# Patient Record
Sex: Female | Born: 1975
Health system: Southern US, Community
[De-identification: ages and names within clinical notes are randomized; demographics above are authoritative.]

## PROBLEM LIST (undated history)

## (undated) DIAGNOSIS — D649 Anemia, unspecified: Secondary | ICD-10-CM

## (undated) DIAGNOSIS — R519 Headache, unspecified: Secondary | ICD-10-CM

## (undated) DIAGNOSIS — T8859XA Other complications of anesthesia, initial encounter: Secondary | ICD-10-CM

## (undated) DIAGNOSIS — Z8739 Personal history of other diseases of the musculoskeletal system and connective tissue: Secondary | ICD-10-CM

## (undated) DIAGNOSIS — Z8742 Personal history of other diseases of the female genital tract: Secondary | ICD-10-CM

## (undated) DIAGNOSIS — T4145XA Adverse effect of unspecified anesthetic, initial encounter: Secondary | ICD-10-CM

## (undated) HISTORY — PX: WISDOM TOOTH EXTRACTION: SHX21

---
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
## 2004-09-26 ENCOUNTER — Other Ambulatory Visit: Admission: RE | Admit: 2004-09-26 | Discharge: 2004-09-26 | Payer: Self-pay | Admitting: Obstetrics and Gynecology

## 2004-09-30 ENCOUNTER — Ambulatory Visit (HOSPITAL_COMMUNITY): Admission: RE | Admit: 2004-09-30 | Discharge: 2004-09-30 | Payer: Self-pay | Admitting: Obstetrics and Gynecology

## 2005-01-21 ENCOUNTER — Ambulatory Visit (HOSPITAL_COMMUNITY): Admission: RE | Admit: 2005-01-21 | Discharge: 2005-01-21 | Payer: Self-pay | Admitting: Obstetrics and Gynecology

## 2005-02-09 ENCOUNTER — Inpatient Hospital Stay (HOSPITAL_COMMUNITY): Admission: AD | Admit: 2005-02-09 | Discharge: 2005-02-12 | Payer: Self-pay | Admitting: *Deleted

## 2010-03-11 ENCOUNTER — Ambulatory Visit (HOSPITAL_COMMUNITY): Admission: RE | Admit: 2010-03-11 | Discharge: 2010-03-11 | Payer: Self-pay | Admitting: Obstetrics

## 2010-07-25 ENCOUNTER — Inpatient Hospital Stay (HOSPITAL_COMMUNITY)
Admission: AD | Admit: 2010-07-25 | Discharge: 2010-07-27 | Payer: Self-pay | Source: Home / Self Care | Attending: Obstetrics & Gynecology | Admitting: Obstetrics & Gynecology

## 2010-07-28 LAB — ABO/RH: ABO/RH(D): O POS

## 2010-07-28 LAB — CBC
HCT: 38.3 % (ref 36.0–46.0)
HCT: 34.2 % — ABNORMAL LOW (ref 36.0–46.0)
Hemoglobin: 13.1 g/dL (ref 12.0–15.0)
Hemoglobin: 11.4 g/dL — ABNORMAL LOW (ref 12.0–15.0)
MCH: 31 pg (ref 26.0–34.0)
MCH: 30.8 pg (ref 26.0–34.0)
MCHC: 34.2 g/dL (ref 30.0–36.0)
MCHC: 33.3 g/dL (ref 30.0–36.0)
MCV: 90.5 fL (ref 78.0–100.0)
MCV: 92.4 fL (ref 78.0–100.0)
Platelets: 208 10*3/uL (ref 150–400)
Platelets: 172 10*3/uL (ref 150–400)
RBC: 4.23 MIL/uL (ref 3.87–5.11)
RBC: 3.7 MIL/uL — ABNORMAL LOW (ref 3.87–5.11)
RDW: 12.5 % (ref 11.5–15.5)
RDW: 12.8 % (ref 11.5–15.5)
WBC: 11 10*3/uL — ABNORMAL HIGH (ref 4.0–10.5)
WBC: 9.5 10*3/uL (ref 4.0–10.5)

## 2010-07-28 LAB — RPR: RPR Ser Ql: NONREACTIVE

## 2010-11-28 NOTE — H&P (Signed)
Rita Ballard             ACCOUNT NO.:  [id number]   MEDICAL RECORD NO.:  [id number]          PATIENT TYPE:  MAT   LOCATION:  [facility]                          FACILITY:  [facility]   PHYSICIAN:  Crist Fat. Rivard, M.D. DATE OF BIRTH:  05/09/1976   DATE OF ADMISSION:  02/09/2005  DATE OF DISCHARGE:                                HISTORY & PHYSICAL   HISTORY OF PRESENT ILLNESS:  Ms. Rita Ballard is a 35 year old married white  female, gravida 2, para 1-0-0-1, at 39-5/7 weeks, who presents with regular  uterine contractions this evening.  She denies leaking, bleeding, or signs  and symptoms of PIH.  Her pregnancy has been followed by the [facility] Certified Nurse Midwife service and has been remarkable for:  1.  Language barrier; 2.  Late to care; 3.  Previous C-section, desires  V-back; 4.  Group B Strep negative.   LABORATORY DATA:  Her prenatal labs were collected on September 26, 2004.  Hemoglobin 12.3, hematocrit 37, platelets 270,000.  Blood type O positive.  Antibody negative.  RPR nonreactive.  Rubella immune.  Hepatitis B surface  antigen negative.  Varicella immune.  Gonorrhea negative, Chlamydia  negative.  Pap smear was within normal limits.  One hour Glucola from Nov 26, 2004, was 112.  Culture of the vaginal tract for group B Strep on January 07, 2005, was negative.   HISTORY OF PRESENT PREGNANCY:  The patient presented for care at [facility]  September 26, 2004 at 20 weeks' gestation.  At that point she shared that she  had had a C-section in the [place] for breech and desired a vaginal birth  for this pregnancy.  Ultrasonography at 20 weeks' gestation showed growth  consistent with previous dating, confirming [facility] of February 11, 2005.  Her  record that she brought from the [place] at 25 weeks' gestation showed she  had Pfannenstiel incision  documented.  Ultrasonography performed for size  greater than dates at 31 weeks shows growth consistent with previous dating  and  estimated fetal weight in the 75th to 90th percentile with consistent  fluid, vertex presentation.  A 37-week ultrasound for ESW showed weight at  3453 to 3610 grams with normal fluid.  The patient met with Dr. Su Hilt at  38 weeks' gestation to review risks and benefits of vaginal birth after  cesarean.  The patient verbalized understanding of the risks to her and her  baby in the case of a uterine rupture.  The rest of her prenatal care has  been unremarkable.   OB HISTORY:  She is gravida 2, para 1-0-0-1.  In February 2003 she had a C-  section for a female infant, weighing 3.75 kg at 40 weeks' gestation under  general anesthesia with a breech presentation, and her name was Rita Ballard.  There were no complications with that pregnancy.   ALLERGIES:  No medication allergies.   PAST MEDICAL HISTORY:  1.  She experienced menarche at the age of 67 with 28-30 day cycles lasting      six days.  2.  The patient had labs from public  health showing rubella immunity.  3.  Nonsurgical hospitalization at age 72 for food poisoning.   PAST SURGICAL HISTORY:  Remarkable for a C=section in 2003.   GENETIC HISTORY:  The patient is a twin.  The patient had an eye problem as  a child that required eye drops and glasses for approximately five years.   SOCIAL HISTORY:  The patient is married to the father of the baby.  His name  is Aeriy.  He is supportive.  The patient has five years of college  education, is a homemaker.  The father of the baby has five years of college  education and is employed in flooring.  They deny any alcohol, tobacco, or  illicit drug use with this pregnancy.  They are [place].   PHYSICAL EXAMINATION:  VITAL SIGNS:  Stable.  She is afebrile.  HEENT:  Grossly within normal limits.  CHEST:  Clear to auscultation.  HEART:  Regular rate and rhythm.  ABDOMEN:  Gravid in contour with fundal height sitting at approximately 40  cm above pubic symphysis.  Fetal heart rate is  reassuming, positive  accelerations with occasional mild variables.  Contractions every 3-4  minutes.  CERVIX:  3 cm, 100%, vertex, -1 station.   ASSESSMENT:  1.  Intrauterine pregnancy at term.  2.  Desires vaginal birth after cesarean (VBAC).  3.  __________   PLAN:  1.  Admit to birthing suite.  Dr. Estanislado Pandy is notified.  2.  Rupture bag of waters.  3.  Give IV pain medications now.  4.  Reviewed with patient about the risks of vaginal birth after cesarean      including fetal distress, emergent c-section, maternal-fetal demise, and      they agreed to proceed.       KS/MEDQ  D:  02/09/2005  T:  02/09/2005  Job:  [id number]

## 2014-07-13 NOTE — L&D Delivery Note (Signed)
Delivery Note At 11:22 PM, on January 02, 2015, a viable female, "Name Undecided, was delivered via Vaginal, Spontaneous Delivery (Presentation: Middle Occiput Anterior) by nurse D. Ansah-Mensah, RNC. Provider in room and infant being placed on mother's abdomen.  Nurse gave tactile stimulation and infant APGARs: 8, 9. Cord clamped, cut, and blood collected.  Signs of placenta separation noted, but no delivery despite maternal pushing efforts.  After 30 minutes post delivery, pitocin started at bolus dosing. After 15 minutes, Crede's maneuver implemented and patient not tolerating pain and requesting pain medication.  Patient given 50 mcg of fentanyl IV and Crede's maneuver restarted with successful placental release at 0009. However, trailing membranes noted and provider able to grasp, with ring forceps, and remove ~ 6cm of membranes with clot noted on the posterior aspect.  Placenta inspection revealed it to be intact with 3VC. Vaginal inspection revealed a 1st degree perineal laceration. Lidocaine 1% used for local anesthetic with a 3.0 Vicryl suture on a SH needle.  Patient tolerated procedure well and peri-care discussed.   Fundus semi-firm, at the umbilicus, and bleeding moderate.  Patient given IM dose of methergine and started on methergine series for bleeding prophylaxis. Mother hemodynamically stable and infant skin to skin prior to provider exit.  Mother unsure of birth control method and opts to breastfeed.  Infant weight at one hour of life: 8lbs 5.7oz  Anesthesia: Local  Episiotomy: None Lacerations: 1st degree Suture Repair: 3.0 vicryl Est. Blood Loss (mL): 618     Mom to postpartum.  Baby to Couplet care / Skin to Skin.  Rita Muta MSN, CNM 01/03/2015, 12:25 AM

## 2014-08-01 LAB — OB RESULTS CONSOLE ABO/RH: RH Type: POSITIVE

## 2014-08-01 LAB — OB RESULTS CONSOLE RPR: RPR: NONREACTIVE

## 2014-08-01 LAB — OB RESULTS CONSOLE HIV ANTIBODY (ROUTINE TESTING): HIV: NONREACTIVE

## 2014-08-01 LAB — OB RESULTS CONSOLE GC/CHLAMYDIA
Chlamydia: NEGATIVE
Gonorrhea: NEGATIVE

## 2014-08-01 LAB — OB RESULTS CONSOLE RUBELLA ANTIBODY, IGM: Rubella: IMMUNE

## 2014-08-01 LAB — OB RESULTS CONSOLE HEPATITIS B SURFACE ANTIGEN: Hepatitis B Surface Ag: NEGATIVE

## 2014-08-01 LAB — OB RESULTS CONSOLE ANTIBODY SCREEN: Antibody Screen: NEGATIVE

## 2015-01-02 ENCOUNTER — Inpatient Hospital Stay (HOSPITAL_COMMUNITY)
Admission: AD | Admit: 2015-01-02 | Discharge: 2015-01-04 | DRG: 775 | Disposition: A | Discharge status: Home / Self Care | Payer: 59 | Source: Ambulatory Visit | Attending: Obstetrics and Gynecology | Admitting: Obstetrics and Gynecology

## 2015-01-02 ENCOUNTER — Encounter (HOSPITAL_COMMUNITY): Payer: Self-pay

## 2015-01-02 DIAGNOSIS — O09523 Supervision of elderly multigravida, third trimester: Secondary | ICD-10-CM

## 2015-01-02 DIAGNOSIS — IMO0001 Reserved for inherently not codable concepts without codable children: Secondary | ICD-10-CM

## 2015-01-02 DIAGNOSIS — O3421 Maternal care for scar from previous cesarean delivery: Principal | ICD-10-CM | POA: Diagnosis present

## 2015-01-02 DIAGNOSIS — Z3A38 38 weeks gestation of pregnancy: Secondary | ICD-10-CM | POA: Diagnosis present

## 2015-01-02 HISTORY — DX: Personal history of other diseases of the musculoskeletal system and connective tissue: Z87.39

## 2015-01-02 HISTORY — DX: Personal history of other diseases of the female genital tract: Z87.42

## 2015-01-02 LAB — TYPE AND SCREEN
ABO/RH(D): O POS
Antibody Screen: NEGATIVE

## 2015-01-02 LAB — CBC
HEMATOCRIT: 39.5 % (ref 36.0–46.0)
Hemoglobin: 13.8 g/dL (ref 12.0–15.0)
MCH: 31.3 pg (ref 26.0–34.0)
MCHC: 34.9 g/dL (ref 30.0–36.0)
MCV: 89.6 fL (ref 78.0–100.0)
PLATELETS: 240 10*3/uL (ref 150–400)
RBC: 4.41 MIL/uL (ref 3.87–5.11)
RDW: 13 % (ref 11.5–15.5)
WBC: 13.4 10*3/uL — AB (ref 4.0–10.5)

## 2015-01-02 LAB — OB RESULTS CONSOLE GBS: GBS: NEGATIVE

## 2015-01-02 MED ORDER — ACETAMINOPHEN 325 MG PO TABS: 650.0000 mg | ORAL_TABLET | ORAL | Status: DC | PRN

## 2015-01-02 MED ORDER — LACTATED RINGERS IV SOLN
INTRAVENOUS | Status: DC
  Administered 2015-01-02: 23:00:00 via INTRAVENOUS

## 2015-01-02 MED ORDER — OXYTOCIN BOLUS FROM INFUSION
500.0000 mL | INTRAVENOUS | Status: DC
  Administered 2015-01-02: 500 mL via INTRAVENOUS

## 2015-01-02 MED ORDER — OXYCODONE-ACETAMINOPHEN 5-325 MG PO TABS: 1.0000 | ORAL_TABLET | ORAL | Status: DC | PRN

## 2015-01-02 MED ORDER — LIDOCAINE HCL (PF) 1 % IJ SOLN
30.0000 mL | INTRAMUSCULAR | Status: DC | PRN
  Administered 2015-01-02: 30 mL via SUBCUTANEOUS
  Filled 2015-01-02: qty 30

## 2015-01-02 MED ORDER — LACTATED RINGERS IV SOLN: 500.0000 mL | INTRAVENOUS | Status: DC | PRN

## 2015-01-02 MED ORDER — ONDANSETRON HCL 4 MG/2ML IJ SOLN: 4.0000 mg | Freq: Four times a day (QID) | INTRAMUSCULAR | Status: DC | PRN

## 2015-01-02 MED ORDER — OXYTOCIN 40 UNITS IN LACTATED RINGERS INFUSION - SIMPLE MED
62.5000 mL/h | INTRAVENOUS | Status: DC
  Administered 2015-01-03: 62.5 mL/h via INTRAVENOUS
  Filled 2015-01-02: qty 1000

## 2015-01-02 MED ORDER — OXYCODONE-ACETAMINOPHEN 5-325 MG PO TABS: 2.0000 | ORAL_TABLET | ORAL | Status: DC | PRN

## 2015-01-02 MED ORDER — CITRIC ACID-SODIUM CITRATE 334-500 MG/5ML PO SOLN: 30.0000 mL | ORAL | Status: DC | PRN

## 2015-01-02 MED ORDER — FENTANYL CITRATE (PF) 100 MCG/2ML IJ SOLN
50.0000 ug | INTRAMUSCULAR | Status: DC | PRN
  Administered 2015-01-03: 50 ug via INTRAVENOUS
  Filled 2015-01-02: qty 2

## 2015-01-02 NOTE — MAU Note (Signed)
Pt c/o ct q 2-5 min. Denies SROM or bleeding. Good fetal movment.

## 2015-01-02 NOTE — MAU Note (Signed)
J.Emly ,CNM at bedside . SVE 7-8/100/-1 with BBOW.  A.Harpr, RN called for bed assignment. Pt to room 166.

## 2015-01-02 NOTE — H&P (Signed)
  Rita Ballard is a 39 y.o. female, G4P3003 at 50 weeks, presenting for contractions since 7pm.  Patient reports increase in intensity and frequency around 10pm.  Patient reports active fetus and denies LOF and VB.  Patient desires un medicated childbirth and is GBS negative. Patient with successful VBAC x 2 and plans another VBAC delivery.   There are no active problems to display for this patient.   History of present pregnancy: Patient entered care at 17.1 weeks.   EDC of 01/16/2015 was established by Definite LMP of 04/11/2014.   Anatomy scan:  20.1 weeks, with normal findings and an anterior placenta.   Additional [place] evaluations:   Anatomy:  Breech, Anterior placenta, No previa, Normal cord insertion. Normal fluid. Open hands, 5th didgit, NB, AA and DA seen   Significant prenatal events:  Patient late to [facility] at 17wks.  Declined Quad testing.  Patient with c/o leg and hip pain with burning and numbness, sleep issues, and pelvic pressure.  Patient reports h/o "clavicle fracture" with previous delivery of 8lb 13 oz infant, but declined additional [place] screening stating knowing weight would increase anxiety regarding delivery.      Last evaluation:  12/27/2014   37 wks 1 days   By S. Devane-Johnson, PH.D, CNM, FHR: 140 , SVE: 0cm / 70% / -2 BP: 122/60 sitting L arm Wt: 195 lbs  OB History    Gravida Para Term Preterm AB TAB SAB Ectopic Multiple Living   4 3 3       3      No past medical history on file. No past surgical history on file. Family History: family history is not on file. Social History:  has no tobacco, alcohol, and drug history on file.   Prenatal Transfer Tool  Maternal Diabetes: No Genetic Screening: Declined Maternal Ultrasounds/Referrals: Normal Fetal Ultrasounds or other Referrals:  None Maternal Substance Abuse:  No Significant Maternal Medications:  None Significant Maternal Lab Results: Lab values include: Group B Strep negative    ROS:  +ctx, -lof, -vb,  +fm  Allergies not on file   Dilation: 7.5 Effacement (%): 100 Station: 0, -1 Exam by:: J.Yvaine Jankowiak,CNM There were no vitals taken for this visit.  Physical Exam  Vitals reviewed. Constitutional: She is oriented to person, place, and time. She appears well-developed and well-nourished.  HENT:  Head: Normocephalic and atraumatic.  Eyes: EOM are normal.  Neck: Normal range of motion.  Cardiovascular: Normal rate and regular rhythm.   Respiratory: Effort normal.  GI: Soft.  Musculoskeletal: Normal range of motion. She exhibits no edema.  Neurological: She is alert and oriented to person, place, and time.  Skin: Skin is warm and dry.     FHR: 150 bpm, Mod Var, -Decels, +Accels UCs:  Q1-70min, palpates moderate to strong  Prenatal labs: ABO, Rh: O/Positive/-- (01/20 0000) Antibody: Negative (01/20 0000) Rubella:    Immune RPR: Nonreactive (01/20 0000)  HBsAg: Negative (01/20 0000)  HIV: Non-reactive (01/20 0000)  GBS: Negative (06/22 0000) Sickle cell/Hgb electrophoresis:  N/A Pap:  None GC:  Negative Chlamydia:  Negative Other:  None   Assessment IUP at 38wks Cat I FT Active Labor-Transition GBS Negative VBAC  Plan: Admit to [place] per consult with Dr. Scotty Court Routine Labor and Delivery Orders per [facility] Protocol Anticipate SVD  Gavin Pound [facility], MSN 01/02/2015, 10:51 PM

## 2015-01-03 ENCOUNTER — Encounter (HOSPITAL_COMMUNITY): Payer: Self-pay

## 2015-01-03 LAB — CBC
HEMATOCRIT: 37.4 % (ref 36.0–46.0)
HEMOGLOBIN: 12.9 g/dL (ref 12.0–15.0)
MCH: 31.3 pg (ref 26.0–34.0)
MCHC: 34.5 g/dL (ref 30.0–36.0)
MCV: 90.8 fL (ref 78.0–100.0)
Platelets: 224 10*3/uL (ref 150–400)
RBC: 4.12 MIL/uL (ref 3.87–5.11)
RDW: 13 % (ref 11.5–15.5)
WBC: 21.7 10*3/uL — ABNORMAL HIGH (ref 4.0–10.5)

## 2015-01-03 LAB — RPR: RPR Ser Ql: NONREACTIVE

## 2015-01-03 MED ORDER — ZOLPIDEM TARTRATE 5 MG PO TABS: 5.0000 mg | ORAL_TABLET | Freq: Every evening | ORAL | Status: DC | PRN

## 2015-01-03 MED ORDER — ONDANSETRON HCL 4 MG/2ML IJ SOLN: 4.0000 mg | INTRAMUSCULAR | Status: DC | PRN

## 2015-01-03 MED ORDER — OXYCODONE-ACETAMINOPHEN 5-325 MG PO TABS: 2.0000 | ORAL_TABLET | ORAL | Status: DC | PRN

## 2015-01-03 MED ORDER — PRENATAL MULTIVITAMIN CH
1.0000 | ORAL_TABLET | Freq: Every day | ORAL | Status: DC
  Administered 2015-01-03: 1 via ORAL
  Filled 2015-01-03: qty 1

## 2015-01-03 MED ORDER — BENZOCAINE-MENTHOL 20-0.5 % EX AERO
1.0000 "application " | INHALATION_SPRAY | CUTANEOUS | Status: DC | PRN
  Administered 2015-01-03: 1 via TOPICAL
  Filled 2015-01-03: qty 56

## 2015-01-03 MED ORDER — WITCH HAZEL-GLYCERIN EX PADS
1.0000 | MEDICATED_PAD | CUTANEOUS | Status: DC | PRN
Start: 2015-01-03 — End: 2015-01-04

## 2015-01-03 MED ORDER — TETANUS-DIPHTH-ACELL PERTUSSIS 5-2.5-18.5 LF-MCG/0.5 IM SUSP: 0.5000 mL | Freq: Once | INTRAMUSCULAR | Status: DC

## 2015-01-03 MED ORDER — DIPHENHYDRAMINE HCL 25 MG PO CAPS: 25.0000 mg | ORAL_CAPSULE | Freq: Four times a day (QID) | ORAL | Status: DC | PRN

## 2015-01-03 MED ORDER — LANOLIN HYDROUS EX OINT: TOPICAL_OINTMENT | CUTANEOUS | Status: DC | PRN

## 2015-01-03 MED ORDER — ACETAMINOPHEN 325 MG PO TABS
650.0000 mg | ORAL_TABLET | ORAL | Status: DC | PRN
  Administered 2015-01-03: 650 mg via ORAL
  Filled 2015-01-03: qty 2

## 2015-01-03 MED ORDER — SIMETHICONE 80 MG PO CHEW: 80.0000 mg | CHEWABLE_TABLET | ORAL | Status: DC | PRN

## 2015-01-03 MED ORDER — DIBUCAINE 1 % RE OINT: 1.0000 "application " | TOPICAL_OINTMENT | RECTAL | Status: DC | PRN

## 2015-01-03 MED ORDER — METHYLERGONOVINE MALEATE 0.2 MG PO TABS
0.2000 mg | ORAL_TABLET | Freq: Three times a day (TID) | ORAL | Status: AC
  Administered 2015-01-03 (×3): 0.2 mg via ORAL
  Filled 2015-01-03 (×3): qty 1

## 2015-01-03 MED ORDER — SENNOSIDES-DOCUSATE SODIUM 8.6-50 MG PO TABS
2.0000 | ORAL_TABLET | ORAL | Status: DC
  Administered 2015-01-04: 2 via ORAL
  Filled 2015-01-03 (×2): qty 2

## 2015-01-03 MED ORDER — METHYLERGONOVINE MALEATE 0.2 MG/ML IJ SOLN
0.2000 mg | Freq: Once | INTRAMUSCULAR | Status: AC
  Administered 2015-01-03: 0.2 mg via INTRAMUSCULAR
  Filled 2015-01-03: qty 1

## 2015-01-03 MED ORDER — ONDANSETRON HCL 4 MG PO TABS: 4.0000 mg | ORAL_TABLET | ORAL | Status: DC | PRN

## 2015-01-03 MED ORDER — OXYCODONE-ACETAMINOPHEN 5-325 MG PO TABS: 1.0000 | ORAL_TABLET | ORAL | Status: DC | PRN

## 2015-01-03 MED ORDER — IBUPROFEN 600 MG PO TABS
600.0000 mg | ORAL_TABLET | Freq: Four times a day (QID) | ORAL | Status: DC
  Administered 2015-01-03 – 2015-01-04 (×4): 600 mg via ORAL
  Filled 2015-01-03 (×5): qty 1

## 2015-01-03 NOTE — Lactation Note (Signed)
This note was copied from the chart of Rita Ballard. Lactation Consultation Note  P4, Ex BF  BF oldest child for 9 months, middle 6 months, youngest for 3 months. Denies questions or concerns. States breastfeeding is going well.  Has hand expressed drops of colostrum. Discussed cluster feeding. Mom encouraged to feed baby 8-12 times/24 hours and with feeding cues.  Mom made aware of O/P services, breastfeeding support groups, community resources, and our phone # for post-discharge questions.     Patient Name: Rita Ballard CLEXN'T Date: 01/03/2015 Reason for consult: Initial assessment   Maternal Data Has patient been taught Hand Expression?: Yes Does the patient have breastfeeding experience prior to this delivery?: Yes  Feeding Feeding Type: Breast Fed Length of feed: 30 min  LATCH Score/Interventions                      Lactation Tools Discussed/Used     Consult Status Consult Status: PRN    Carlye Grippe 01/03/2015, 3:02 PM

## 2015-01-03 NOTE — Progress Notes (Signed)
Subjective: Postpartum Day 1: Vaginal delivery (VBAC x 3), 1st degree perineal laceration Patient up ad lib, reports no syncope or dizziness. Feeding:  Breast Contraceptive plan:  Undecided  Family declines circumcision.  Objective: Vital signs in last 24 hours: Temp:  [97.4 F (36.3 C)-98.3 F (36.8 C)] 97.4 F (36.3 C) (06/23 0643) Pulse Rate:  [51-69] 53 (06/23 0643) Resp:  [18-22] 18 (06/23 0100) BP: (120-144)/(54-86) 126/68 mmHg (06/23 0643) SpO2:  [98 %] 98 % (06/23 0643) Weight:  [86.637 kg (191 lb)] 86.637 kg (191 lb) (06/22 2341)  Physical Exam:  General: alert Lochia: appropriate Uterine Fundus: firm Perineum: healing well DVT Evaluation: No evidence of DVT seen on physical exam. Negative Homan's sign.    Recent Labs  01/02/15 2310  HGB 13.8  HCT 39.5   Day 1 CBC results delayed in resulting due to internet issue.  Assessment/Plan: Status post vaginal delivery day 1. Stable Continue current care. Plan for discharge tomorrow    Allena Katz 01/03/2015, 9:13 AM

## 2015-01-04 MED ORDER — OXYCODONE-ACETAMINOPHEN 5-325 MG PO TABS: 1.0000 | ORAL_TABLET | ORAL | Status: DC | PRN

## 2015-01-04 MED ORDER — IBUPROFEN 600 MG PO TABS: 600.0000 mg | ORAL_TABLET | Freq: Four times a day (QID) | ORAL | Status: DC

## 2015-01-04 NOTE — Discharge Summary (Signed)
Vaginal Delivery Discharge Summary  ALL information will be verified prior to discharge  Rita Ballard  DOB:    1975/11/17 MRN:    [id number] CSN:    [id number]  Date of admission:                  01/02/15  Date of discharge:                   01/04/15  Procedures this admission: SVD  Date of Delivery: 01/02/15  Newborn Data:  Live born  Information for the patient's newborn:  Rickey, Farrier [[id number]]  female   Live born female  Birth Weight: 8 lb 5.7 oz (3790 g) APGAR: 8, 9  Home with mother. Name: Rita Ballard Circumcision Plan: none   History of Present Illness: Ms. BRIELLA HOBDAY is a 39 y.o. female, [phone], who presents at [redacted]w[redacted]d weeks gestation. The patient has been followed at the [facility] and Gynecology division of [facility]. She was admitted onset of labor. Her pregnancy has been complicated by:  Patient Active Problem List   Diagnosis Date Noted  . SVD (spontaneous vaginal delivery) 01/03/2015  . First degree perineal laceration during delivery 01/03/2015  . Active labor at term 01/02/2015    Hospital course: The patient was admitted for labor.   Her labor was not complicated. She proceeded to have a vaginal delivery of a healthy infant. Her delivery was not complicated. Her postpartum course was not complicated. She was discharged to home on postpartum day 2 doing well.  Feeding: breast  Contraception: no method  Discharge hemoglobin: HEMOGLOBIN  Date Value Ref Range Status  01/03/2015 12.9 12.0 - 15.0 g/dL Final   HCT  Date Value Ref Range Status  01/03/2015 37.4 36.0 - 46.0 % Final    PreNatal Labs ABO, Rh: --/--/O POS (06/22 2310)   Antibody: NEG (06/22 2310) Rubella:    immune RPR: Non Reactive (06/22 2310)  HBsAg: Negative (01/20 0000)  HIV: Non-reactive (01/20 0000)  GBS: Negative (06/22 0000)  Discharge Physical Exam:  General: alert and cooperative Lochia: appropriate Uterine Fundus:  firm Incision: healing well DVT Evaluation: No evidence of DVT seen on physical exam.  Intrapartum Procedures: spontaneous vaginal delivery Postpartum Procedures: none Complications-Operative and Postpartum: 1 degree perineal laceration  Discharge Diagnoses: Term Pregnancy-delivered,    Activity:           pelvic rest Diet:                routine Medications: PNV, Ibuprofen and Percocet Condition:      stable     Postpartum Teaching: Nutrition, exercise, return to work or school, family visits, sexual activity, home rest, vaginal bleeding, pelvic rest, family planning, s/s of PPD, breast care peri-care and incision care   Discharge to: home  Follow-up Information    Follow up with [facility]. Schedule an appointment as soon as possible for a visit in 6 weeks.   Specialty:  Obstetrics and Gynecology   Why:  Postpartum check up   Contact information:   [address] [place] [address] [phone]       Eleisha Branscomb, CNM, MSN 01/04/2015. 8:46 AM  All information will be verified prior to discharge   Postpartum Care After Vaginal Delivery  After you deliver your newborn (postpartum period), the usual stay in the hospital is 24 72 hours. If there were problems with your labor or delivery, or  if you have other medical problems, you might be in the hospital longer.  While you are in the hospital, you will receive help and instructions on how to care for yourself and your newborn during the postpartum period.  While you are in the hospital:  Be sure to tell your nurses if you have pain or discomfort, as well as where you feel the pain and what makes the pain worse.  If you had an incision made near your vagina (episiotomy) or if you had some tearing during delivery, the nurses may put ice packs on your episiotomy or tear. The ice packs may help to reduce the pain and swelling.  If you are breastfeeding, you  may feel uncomfortable contractions of your uterus for a couple of weeks. This is normal. The contractions help your uterus get back to normal size.  It is normal to have some bleeding after delivery.  For the first 1 3 days after delivery, the flow is red and the amount may be similar to a period.  It is common for the flow to start and stop.  In the first few days, you may pass some small clots. Let your nurses know if you begin to pass large clots or your flow increases.  Do not  flush blood clots down the toilet before having the nurse look at them.  During the next 3 10 days after delivery, your flow should become more watery and pink or brown-tinged in color.  Ten to fourteen days after delivery, your flow should be a small amount of yellowish-white discharge.  The amount of your flow will decrease over the first few weeks after delivery. Your flow may stop in 6 8 weeks. Most women have had their flow stop by 12 weeks after delivery.  You should change your sanitary pads frequently.  Wash your hands thoroughly with soap and water for at least 20 seconds after changing pads, using the toilet, or before holding or feeding your newborn.  You should feel like you need to empty your bladder within the first 6 8 hours after delivery.  In case you become weak, lightheaded, or faint, call your nurse before you get out of bed for the first time and before you take a shower for the first time.  Within the first few days after delivery, your breasts may begin to feel tender and full. This is called engorgement. Breast tenderness usually goes away within 48 72 hours after engorgement occurs. You may also notice milk leaking from your breasts. If you are not breastfeeding, do not stimulate your breasts. Breast stimulation can make your breasts produce more milk.  Spending as much time as possible with your newborn is very important. During this time, you and your newborn can feel close and get  to know each other. Having your newborn stay in your room (rooming in) will help to strengthen the bond with your newborn. It will give you time to get to know your newborn and become comfortable caring for your newborn.  Your hormones change after delivery. Sometimes the hormone changes can temporarily cause you to feel sad or tearful. These feelings should not last more than a few days. If these feelings last longer than that, you should talk to your caregiver.  If desired, talk to your caregiver about methods of family planning or contraception.  Talk to your caregiver about immunizations. Your caregiver may want you to have the following immunizations before leaving the hospital:  Tetanus, diphtheria, and  pertussis (Tdap) or tetanus and diphtheria (Td) immunization. It is very important that you and your family (including grandparents) or others caring for your newborn are up-to-date with the Tdap or Td immunizations. The Tdap or Td immunization can help protect your newborn from getting ill.  Rubella immunization.  Varicella (chickenpox) immunization.  Influenza immunization. You should receive this annual immunization if you did not receive the immunization during your pregnancy. Document Released: 04/26/2007 Document Revised: 03/23/2012 Document Reviewed: 02/24/2012 [facility] Patient Information 2014 [place].   Postpartum Depression and Baby Blues  The postpartum period begins right after the birth of a baby. During this time, there is often a great amount of joy and excitement. It is also a time of considerable changes in the life of the parent(s). Regardless of how many times a mother gives birth, each child brings new challenges and dynamics to the family. It is not unusual to have feelings of excitement accompanied by confusing shifts in moods, emotions, and thoughts. All mothers are at risk of developing postpartum depression or the "baby blues." These mood changes can occur  right after giving birth, or they may occur many months after giving birth. The baby blues or postpartum depression can be mild or severe. Additionally, postpartum depression can resolve rather quickly, or it can be a long-term condition. CAUSES Elevated hormones and their rapid decline are thought to be a main cause of postpartum depression and the baby blues. There are a number of hormones that radically change during and after pregnancy. Estrogen and progesterone usually decrease immediately after delivering your baby. The level of thyroid hormone and various cortisol steroids also rapidly drop. Other factors that play a major role in these changes include major life events and genetics.  RISK FACTORS If you have any of the following risks for the baby blues or postpartum depression, know what symptoms to watch out for during the postpartum period. Risk factors that may increase the likelihood of getting the baby blues or postpartum depression include: 1. Havinga personal or family history of depression. 2. Having depression while being pregnant. 3. Having premenstrual or oral contraceptive-associated mood issues. 4. Having exceptional life stress. 5. Having marital conflict. 6. Lacking a social support network. 7. Having a baby with special needs. 8. Having health problems such as diabetes. SYMPTOMS Baby blues symptoms include:  Brief fluctuations in mood, such as going from extreme happiness to sadness.  Decreased concentration.  Difficulty sleeping.  Crying spells, tearfulness.  Irritability.  Anxiety. Postpartum depression symptoms typically begin within the first month after giving birth. These symptoms include:  Difficulty sleeping or excessive sleepiness.  Marked weight loss.  Agitation.  Feelings of worthlessness.  Lack of interest in activity or food. Postpartum psychosis is a very concerning condition and can be dangerous. Fortunately, it is rare. Displaying any of  the following symptoms is cause for immediate medical attention. Postpartum psychosis symptoms include:  Hallucinations and delusions.  Bizarre or disorganized behavior.  Confusion or disorientation. DIAGNOSIS  A diagnosis is made by an evaluation of your symptoms. There are no medical or lab tests that lead to a diagnosis, but there are various questionnaires that a caregiver may use to identify those with the baby blues, postpartum depression, or psychosis. Often times, a screening tool called the [place] Postnatal Depression Scale is used to diagnose depression in the postpartum period.  TREATMENT The baby blues usually goes away on its own in 1 to 2 weeks. Social support is often all that is needed.  You should be encouraged to get adequate sleep and rest. Occasionally, you may be given medicines to help you sleep.  Postpartum depression requires treatment as it can last several months or longer if it is not treated. Treatment may include individual or group therapy, medicine, or both to address any social, physiological, and psychological factors that may play a role in the depression. Regular exercise, a healthy diet, rest, and social support may also be strongly recommended.  Postpartum psychosis is more serious and needs treatment right away. Hospitalization is often needed. HOME CARE INSTRUCTIONS  Get as much rest as you can. Nap when the baby sleeps.  Exercise regularly. Some women find yoga and walking to be beneficial.  Eat a balanced and nourishing diet.  Do little things that you enjoy. Have a cup of tea, take a bubble bath, read your favorite magazine, or listen to your favorite music.  Avoid alcohol.  Ask for help with household chores, cooking, grocery shopping, or running errands as needed. Do not try to do everything.  Talk to people close to you about how you are feeling. Get support from your partner, family members, friends, or other new moms.  Try to stay positive  in how you think. Think about the things you are grateful for.  Do not spend a lot of time alone.  Only take medicine as directed by your caregiver.  Keep all your postpartum appointments.  Let your caregiver know if you have any concerns. SEEK MEDICAL CARE IF: You are having a reaction or problems with your medicine. SEEK IMMEDIATE MEDICAL CARE IF:  You have suicidal feelings.  You feel you may harm the baby or someone else. Document Released: 04/02/2004 Document Revised: 09/21/2011 Document Reviewed: 05/05/2011 [facility] Patient Information 2014 [place], [place].     Breastfeeding Deciding to breastfeed is one of the best choices you can make for you and your baby. A change in hormones during pregnancy causes your breast tissue to grow and increases the number and size of your milk ducts. These hormones also allow proteins, sugars, and fats from your blood supply to make breast milk in your milk-producing glands. Hormones prevent breast milk from being released before your baby is born as well as prompt milk flow after birth. Once breastfeeding has begun, thoughts of your baby, as well as his or her sucking or crying, can stimulate the release of milk from your milk-producing glands.  BENEFITS OF BREASTFEEDING For Your Baby  Your first milk (colostrum) helps your baby's digestive system function better.   There are antibodies in your milk that help your baby fight off infections.   Your baby has a lower incidence of asthma, allergies, and sudden infant death syndrome.   The nutrients in breast milk are better for your baby than infant formulas and are designed uniquely for your baby's needs.   Breast milk improves your baby's brain development.   Your baby is less likely to develop other conditions, such as childhood obesity, asthma, or type 2 diabetes mellitus.  For You   Breastfeeding helps to create a very special bond between you and your baby.   Breastfeeding is  convenient. Breast milk is always available at the correct temperature and costs nothing.   Breastfeeding helps to burn calories and helps you lose the weight gained during pregnancy.   Breastfeeding makes your uterus contract to its prepregnancy size faster and slows bleeding (lochia) after you give birth.   Breastfeeding helps to lower your risk of developing  type 2 diabetes mellitus, osteoporosis, and breast or ovarian cancer later in life. SIGNS THAT YOUR BABY IS HUNGRY Early Signs of Hunger  Increased alertness or activity.  Stretching.  Movement of the head from side to side.  Movement of the head and opening of the mouth when the corner of the mouth or cheek is stroked (rooting).  Increased sucking sounds, smacking lips, cooing, sighing, or squeaking.  Hand-to-mouth movements.  Increased sucking of fingers or hands. Late Signs of Hunger  Fussing.  Intermittent crying. Extreme Signs of Hunger Signs of extreme hunger will require calming and consoling before your baby will be able to breastfeed successfully. Do not wait for the following signs of extreme hunger to occur before you initiate breastfeeding:   Restlessness.  A loud, strong cry.   Screaming.   BREASTFEEDING BASICS Breastfeeding Initiation  Find a comfortable place to sit or lie down, with your neck and back well supported.  Place a pillow or rolled up blanket under your baby to bring him or her to the level of your breast (if you are seated). Nursing pillows are specially designed to help support your arms and your baby while you breastfeed.  Make sure that your baby's abdomen is facing your abdomen.   Gently massage your breast. With your fingertips, massage from your chest wall toward your nipple in a circular motion. This encourages milk flow. You may need to continue this action during the feeding if your milk flows slowly.  Support your breast with 4 fingers underneath and your thumb above  your nipple. Make sure your fingers are well away from your nipple and your baby's mouth.   Stroke your baby's lips gently with your finger or nipple.   When your baby's mouth is open wide enough, quickly bring your baby to your breast, placing your entire nipple and as much of the colored area around your nipple (areola) as possible into your baby's mouth.   More areola should be visible above your baby's upper lip than below the lower lip.   Your baby's tongue should be between his or her lower gum and your breast.   Ensure that your baby's mouth is correctly positioned around your nipple (latched). Your baby's lips should create a seal on your breast and be turned out (everted).  It is common for your baby to suck about 2-3 minutes in order to start the flow of breast milk. Latching Teaching your baby how to latch on to your breast properly is very important. An improper latch can cause nipple pain and decreased milk supply for you and poor weight gain in your baby. Also, if your baby is not latched onto your nipple properly, he or she may swallow some air during feeding. This can make your baby fussy. Burping your baby when you switch breasts during the feeding can help to get rid of the air. However, teaching your baby to latch on properly is still the best way to prevent fussiness from swallowing air while breastfeeding. Signs that your baby has successfully latched on to your nipple:    Silent tugging or silent sucking, without causing you pain.   Swallowing heard between every 3-4 sucks.    Muscle movement above and in front of his or her ears while sucking.  Signs that your baby has not successfully latched on to nipple:   Sucking sounds or smacking sounds from your baby while breastfeeding.  Nipple pain. If you think your baby has not latched on correctly,  slip your finger into the corner of your baby's mouth to break the suction and place it between your baby's gums.  Attempt breastfeeding initiation again. Signs of Successful Breastfeeding Signs from your baby:   A gradual decrease in the number of sucks or complete cessation of sucking.   Falling asleep.   Relaxation of his or her body.   Retention of a small amount of milk in his or her mouth.   Letting go of your breast by himself or herself. Signs from you:  Breasts that have increased in firmness, weight, and size 1-3 hours after feeding.   Breasts that are softer immediately after breastfeeding.  Increased milk volume, as well as a change in milk consistency and color by the fifth day of breastfeeding.   Nipples that are not sore, cracked, or bleeding. Signs That Your [employer]el Books is Getting Enough Milk  Wetting at least 3 diapers in a 24-hour period. The urine should be clear and pale yellow by age 688 days.  At least 3 stools in a 24-hour period by age 688 days. The stool should be soft and yellow.  At least 3 stools in a 24-hour period by age 68 days. The stool should be seedy and yellow.  No loss of weight greater than 10% of birth weight during the first 54 days of age.  Average weight gain of 4-7 ounces (113-198 g) per week after age 33 days.  Consistent daily weight gain by age 29 days, without weight loss after the age of 2 weeks. After a feeding, your baby may spit up a small amount. This is common. BREASTFEEDING FREQUENCY AND DURATION Frequent feeding will help you make more milk and can prevent sore nipples and breast engorgement. Breastfeed when you feel the need to reduce the fullness of your breasts or when your baby shows signs of hunger. This is called "breastfeeding on demand." Avoid introducing a pacifier to your baby while you are working to establish breastfeeding (the first 4-6 weeks after your baby is born). After this time you may choose to use a pacifier. Research has shown that pacifier use during the first year of a baby's life decreases the risk of sudden infant death  syndrome (SIDS). Allow your baby to feed on each breast as long as he or she wants. Breastfeed until your baby is finished feeding. When your baby unlatches or falls asleep while feeding from the first breast, offer the second breast. Because newborns are often sleepy in the first few weeks of life, you may need to awaken your baby to get him or her to feed. Breastfeeding times will vary from baby to baby. However, the following rules can serve as a guide to help you ensure that your baby is properly fed:  Newborns (babies 50 weeks of age or younger) may breastfeed every 1-3 hours.  Newborns should not go longer than 3 hours during the day or 5 hours during the night without breastfeeding.  You should breastfeed your baby a minimum of 8 times in a 24-hour period until you begin to introduce solid foods to your baby at around 69 months of age. BREAST MILK PUMPING Pumping and storing breast milk allows you to ensure that your baby is exclusively fed your breast milk, even at times when you are unable to breastfeed. This is especially important if you are going back to work while you are still breastfeeding or when you are not able to be present during feedings. Your lactation consultant can give  you guidelines on how long it is safe to store breast milk.  A breast pump is a machine that allows you to pump milk from your breast into a sterile bottle. The pumped breast milk can then be stored in a refrigerator or freezer. Some breast pumps are operated by hand, while others use electricity. Ask your lactation consultant which type will work best for you. Breast pumps can be purchased, but some hospitals and breastfeeding support groups lease breast pumps on a monthly basis. A lactation consultant can teach you how to hand express breast milk, if you prefer not to use a pump.  CARING FOR YOUR BREASTS WHILE YOU BREASTFEED Nipples can become dry, cracked, and sore while breastfeeding. The following  recommendations can help keep your breasts moisturized and healthy:  Avoid using soap on your nipples.   Wear a supportive bra. Although not required, special nursing bras and tank tops are designed to allow access to your breasts for breastfeeding without taking off your entire bra or top. Avoid wearing underwire-style bras or extremely tight bras.  Air dry your nipples for 3-88minutes after each feeding.   Use only cotton bra pads to absorb leaked breast milk. Leaking of breast milk between feedings is normal.   Use lanolin on your nipples after breastfeeding. Lanolin helps to maintain your skin's normal moisture barrier. If you use pure lanolin, you do not need to wash it off before feeding your baby again. Pure lanolin is not toxic to your baby. You may also hand express a few drops of breast milk and gently massage that milk into your nipples and allow the milk to air dry. In the first few weeks after giving birth, some women experience extremely full breasts (engorgement). Engorgement can make your breasts feel heavy, warm, and tender to the touch. Engorgement peaks within 3-5 days after you give birth. The following recommendations can help ease engorgement:  Completely empty your breasts while breastfeeding or pumping. You may want to start by applying warm, moist heat (in the shower or with warm water-soaked hand towels) just before feeding or pumping. This increases circulation and helps the milk flow. If your baby does not completely empty your breasts while breastfeeding, pump any extra milk after he or she is finished.  Wear a snug bra (nursing or regular) or tank top for 1-2 days to signal your body to slightly decrease milk production.  Apply ice packs to your breasts, unless this is too uncomfortable for you.  Make sure that your baby is latched on and positioned properly while breastfeeding. If engorgement persists after 48 hours of following these recommendations, contact  your health care provider or a Science writer. OVERALL HEALTH CARE RECOMMENDATIONS WHILE BREASTFEEDING  Eat healthy foods. Alternate between meals and snacks, eating 3 of each per day. Because what you eat affects your breast milk, some of the foods may make your baby more irritable than usual. Avoid eating these foods if you are sure that they are negatively affecting your baby.  Drink milk, fruit juice, and water to satisfy your thirst (about 10 glasses a day).   Rest often, relax, and continue to take your prenatal vitamins to prevent fatigue, stress, and anemia.  Continue breast self-awareness checks.  Avoid chewing and smoking tobacco.  Avoid alcohol and drug use. Some medicines that may be harmful to your baby can pass through breast milk. It is important to ask your health care provider before taking any medicine, including all over-the-counter and prescription medicine  as well as vitamin and herbal supplements. It is possible to become pregnant while breastfeeding. If birth control is desired, ask your health care provider about options that will be safe for your baby. SEEK MEDICAL CARE IF:   You feel like you want to stop breastfeeding or have become frustrated with breastfeeding.  You have painful breasts or nipples.  Your nipples are cracked or bleeding.  Your breasts are red, tender, or warm.  You have a swollen area on either breast.  You have a fever or chills.  You have nausea or vomiting.  You have drainage other than breast milk from your nipples.  Your breasts do not become full before feedings by the fifth day after you give birth.  You feel sad and depressed.  Your baby is too sleepy to eat well.  Your baby is having trouble sleeping.   Your baby is wetting less than 3 diapers in a 24-hour period.  Your baby has less than 3 stools in a 24-hour period.  Your baby's skin or the white part of his or her eyes becomes yellow.   Your baby is not  gaining weight by 102 days of age. SEEK IMMEDIATE MEDICAL CARE IF:   Your baby is overly tired (lethargic) and does not want to wake up and feed.  Your baby develops an unexplained fever. Document Released: 06/29/2005 Document Revised: 07/04/2013 Document Reviewed: 12/21/2012 [facility] Patient Information 2015 [place], [place]. This information is not intended to replace advice given to you by your health care provider. Make sure you discuss any questions you have with your health care provider.

## 2016-07-15 DIAGNOSIS — M9901 Segmental and somatic dysfunction of cervical region: Secondary | ICD-10-CM | POA: Diagnosis not present

## 2016-07-15 DIAGNOSIS — M531 Cervicobrachial syndrome: Secondary | ICD-10-CM | POA: Diagnosis not present

## 2016-07-15 DIAGNOSIS — R51 Headache: Secondary | ICD-10-CM | POA: Diagnosis not present

## 2016-07-22 DIAGNOSIS — R51 Headache: Secondary | ICD-10-CM | POA: Diagnosis not present

## 2016-07-22 DIAGNOSIS — M9901 Segmental and somatic dysfunction of cervical region: Secondary | ICD-10-CM | POA: Diagnosis not present

## 2016-07-22 DIAGNOSIS — M531 Cervicobrachial syndrome: Secondary | ICD-10-CM | POA: Diagnosis not present

## 2016-07-31 DIAGNOSIS — Z01419 Encounter for gynecological examination (general) (routine) without abnormal findings: Secondary | ICD-10-CM | POA: Diagnosis not present

## 2016-08-19 DIAGNOSIS — M531 Cervicobrachial syndrome: Secondary | ICD-10-CM | POA: Diagnosis not present

## 2016-08-19 DIAGNOSIS — M9901 Segmental and somatic dysfunction of cervical region: Secondary | ICD-10-CM | POA: Diagnosis not present

## 2016-08-19 DIAGNOSIS — R51 Headache: Secondary | ICD-10-CM | POA: Diagnosis not present

## 2016-11-04 DIAGNOSIS — J069 Acute upper respiratory infection, unspecified: Secondary | ICD-10-CM | POA: Diagnosis not present

## 2016-12-02 DIAGNOSIS — Z1329 Encounter for screening for other suspected endocrine disorder: Secondary | ICD-10-CM | POA: Diagnosis not present

## 2016-12-02 DIAGNOSIS — Z131 Encounter for screening for diabetes mellitus: Secondary | ICD-10-CM | POA: Diagnosis not present

## 2016-12-02 DIAGNOSIS — Z13220 Encounter for screening for lipoid disorders: Secondary | ICD-10-CM | POA: Diagnosis not present

## 2016-12-02 DIAGNOSIS — Z0000 Encounter for general adult medical examination without abnormal findings: Secondary | ICD-10-CM | POA: Diagnosis not present

## 2017-08-11 DIAGNOSIS — Z01419 Encounter for gynecological examination (general) (routine) without abnormal findings: Secondary | ICD-10-CM | POA: Diagnosis not present

## 2017-10-16 DIAGNOSIS — J329 Chronic sinusitis, unspecified: Secondary | ICD-10-CM | POA: Diagnosis not present

## 2017-10-20 DIAGNOSIS — J069 Acute upper respiratory infection, unspecified: Secondary | ICD-10-CM | POA: Diagnosis not present

## 2018-03-16 DIAGNOSIS — Z136 Encounter for screening for cardiovascular disorders: Secondary | ICD-10-CM | POA: Diagnosis not present

## 2018-03-16 DIAGNOSIS — Z0000 Encounter for general adult medical examination without abnormal findings: Secondary | ICD-10-CM | POA: Diagnosis not present

## 2019-07-14 DIAGNOSIS — C801 Malignant (primary) neoplasm, unspecified: Secondary | ICD-10-CM

## 2019-07-14 HISTORY — DX: Malignant (primary) neoplasm, unspecified: C80.1

## 2019-07-26 ENCOUNTER — Ambulatory Visit: Payer: 59 | Attending: Internal Medicine

## 2019-07-26 ENCOUNTER — Other Ambulatory Visit: Payer: Self-pay

## 2019-07-26 DIAGNOSIS — Z20822 Contact with and (suspected) exposure to covid-19: Secondary | ICD-10-CM

## 2019-07-27 LAB — NOVEL CORONAVIRUS, NAA: SARS-CoV-2, NAA: NOT DETECTED

## 2019-09-19 ENCOUNTER — Other Ambulatory Visit: Payer: Self-pay | Admitting: Obstetrics and Gynecology

## 2019-09-19 DIAGNOSIS — R921 Mammographic calcification found on diagnostic imaging of breast: Secondary | ICD-10-CM

## 2019-10-06 ENCOUNTER — Other Ambulatory Visit: Payer: 59

## 2019-10-06 ENCOUNTER — Other Ambulatory Visit: Payer: Self-pay | Admitting: Obstetrics and Gynecology

## 2019-10-06 ENCOUNTER — Other Ambulatory Visit: Payer: Self-pay

## 2019-10-06 ENCOUNTER — Ambulatory Visit
Admission: RE | Admit: 2019-10-06 | Discharge: 2019-10-06 | Disposition: A | Discharge status: Home / Self Care | Payer: 59 | Source: Ambulatory Visit | Attending: Obstetrics and Gynecology | Admitting: Obstetrics and Gynecology

## 2019-10-06 DIAGNOSIS — R921 Mammographic calcification found on diagnostic imaging of breast: Secondary | ICD-10-CM

## 2019-10-13 ENCOUNTER — Other Ambulatory Visit: Payer: Self-pay

## 2019-10-13 ENCOUNTER — Ambulatory Visit
Admission: RE | Admit: 2019-10-13 | Discharge: 2019-10-13 | Disposition: A | Discharge status: Home / Self Care | Payer: 59 | Source: Ambulatory Visit | Attending: Obstetrics and Gynecology | Admitting: Obstetrics and Gynecology

## 2019-10-13 DIAGNOSIS — R921 Mammographic calcification found on diagnostic imaging of breast: Secondary | ICD-10-CM

## 2019-10-24 NOTE — Progress Notes (Signed)
[place] CONSULT NOTE  Patient Care Team: Patient, [name] as PCP - General (General Practice)  CHIEF COMPLAINTS/PURPOSE OF CONSULTATION:  Newly diagnosed left breast DCIS  HISTORY OF PRESENTING ILLNESS:  Rita Ballard 44 y.o. female is here because of recent diagnosis of left breast ductal carcinoma in situ. Screening mammogram detected left breast calcifications. Diagnostic mammogram on 10/06/19 showed calcifications involving the majority of the upper outer left breast, spanning 9.0cm in total. Biopsy on 10/13/19 showed high grade DCIS, ER/PR negative. She presents to the clinic today for initial evaluation and discussion of treatment options.    I reviewed her records extensively and collaborated the history with the patient.  SUMMARY OF ONCOLOGIC HISTORY: Oncology History  Malignant neoplasm of upper-outer quadrant of left breast in female, estrogen receptor negative ([place])  10/13/2019 Cancer Staging   Staging form: Breast, AJCC 8th Edition - Clinical stage from 10/13/2019: Stage 0 (cTis (DCIS), cN0, cM0, ER-, PR-) - Signed by Gardenia Phlegm, NP on 10/25/2019   10/13/2019 Initial Diagnosis   Screening detected left breast calcifications with high breast density, UOQ: 9 x 8 x 5 cm.  Anterior and posterior end of the calcifications were biopsied revealing high-grade DCIS with calcifications ER 0%, PR 0%     MEDICAL HISTORY:  Past Medical History:  Diagnosis Date  . Hx of endometriosis   . Hx of scoliosis    As a child    SURGICAL HISTORY: Past Surgical History:  Procedure Laterality Date  . CESAREAN SECTION    . WISDOM TOOTH EXTRACTION      SOCIAL HISTORY: Social History   Socioeconomic History  . Marital status: Married    Spouse name: Not on file  . Number of children: Not on file  . Years of education: Not on file  . Highest education level: Not on file  Occupational History  . Not on file  Tobacco Use  . Smoking status: Never  Smoker  . Smokeless tobacco: Never Used  Substance and Sexual Activity  . Alcohol use: No  . Drug use: No  . Sexual activity: Not on file  Other Topics Concern  . Not on file  Social History Narrative  . Not on file   Social Determinants of Health   Financial Resource Strain:   . Difficulty of Paying Living Expenses:   Food Insecurity:   . Worried About Charity fundraiser in the Last Year:   . Arboriculturist in the Last Year:   Transportation Needs:   . Film/video editor (Medical):   Marland Kitchen Lack of Transportation (Non-Medical):   Physical Activity:   . Days of Exercise per Week:   . Minutes of Exercise per Session:   Stress:   . Feeling of Stress :   Social Connections:   . Frequency of Communication with Friends and Family:   . Frequency of Social Gatherings with Friends and Family:   . Attends Religious Services:   . Active Member of Clubs or Organizations:   . Attends Archivist Meetings:   Marland Kitchen Marital Status:   Intimate Partner Violence:   . Fear of Current or Ex-Partner:   . Emotionally Abused:   Marland Kitchen Physically Abused:   . Sexually Abused:     FAMILY HISTORY: No family history on file.  ALLERGIES:  has No Known Allergies.  MEDICATIONS:  Current Outpatient Medications  Medication Sig Dispense Refill  . ibuprofen (ADVIL,MOTRIN) 600 MG tablet Take 1 tablet (  600 mg total) by mouth every 6 (six) hours. 30 tablet 0  . oxyCODONE-acetaminophen (PERCOCET/ROXICET) 5-325 MG per tablet Take 1 tablet by mouth every 4 (four) hours as needed (for pain scale 4-7). 30 tablet 0  . Prenatal Vit-Fe Fumarate-FA (PRENATAL MULTIVITAMIN) TABS tablet Take 1 tablet by mouth daily at 12 noon.     No current facility-administered medications for this visit.    REVIEW OF SYSTEMS:   Constitutional: Denies fevers, chills or abnormal night sweats Eyes: Denies blurriness of vision, double vision or watery eyes Ears, nose, mouth, throat, and face: Denies mucositis or sore  throat Respiratory: Denies cough, dyspnea or wheezes Cardiovascular: Denies palpitation, chest discomfort or lower extremity swelling Gastrointestinal:  Denies nausea, heartburn or change in bowel habits Skin: Denies abnormal skin rashes Lymphatics: Denies new lymphadenopathy or easy bruising Neurological:Denies numbness, tingling or new weaknesses Behavioral/Psych: Mood is stable, no new changes  Breast: Denies any palpable lumps or discharge All other systems were reviewed with the patient and are negative.  PHYSICAL EXAMINATION: ECOG PERFORMANCE STATUS: 0 - Asymptomatic  There were no vitals filed for this visit. Filed Weights   10/25/19 1426  Weight: 176 lb 3.2 oz (79.9 kg)    GENERAL:alert, no distress and comfortable SKIN: skin color, texture, turgor are normal, no rashes or significant lesions EYES: normal, conjunctiva are pink and non-injected, sclera clear OROPHARYNX:no exudate, no erythema and lips, buccal mucosa, and tongue normal  NECK: supple, thyroid normal size, non-tender, without nodularity LYMPH:  no palpable lymphadenopathy in the cervical, axillary or inguinal LUNGS: clear to auscultation and percussion with normal breathing effort HEART: regular rate & rhythm and no murmurs and no lower extremity edema ABDOMEN:abdomen soft, non-tender and normal bowel sounds Musculoskeletal:no cyanosis of digits and no clubbing  PSYCH: alert & oriented x 3 with fluent speech NEURO: no focal motor/sensory deficits BREAST: No palpable nodules in breast. No palpable axillary or supraclavicular lymphadenopathy (exam performed in the presence of a chaperone)   LABORATORY DATA:  I have reviewed the data as listed Lab Results  Component Value Date   WBC 21.7 (H) 01/03/2015   HGB 12.9 01/03/2015   HCT 37.4 01/03/2015   MCV 90.8 01/03/2015   PLT 224 01/03/2015   No results found for: NA, K, CL, CO2  RADIOGRAPHIC STUDIES: I have personally reviewed the radiological reports  and agreed with the findings in the report.  ASSESSMENT AND PLAN:  Malignant neoplasm of upper-outer quadrant of left breast in female, estrogen receptor negative ([place]) 10/13/2019:Screening detected left breast calcifications with high breast density, UOQ: 9 x 8 x 5 cm.  Anterior and posterior end of the calcifications were biopsied revealing high-grade DCIS with calcifications ER 0%, PR 0% Tis NX stage 0  Pathology review: I discussed with the patient the difference between DCIS and invasive breast cancer. It is considered a precancerous lesion. DCIS is classified as a 0. It is generally detected through mammograms as calcifications. We discussed the significance of grades and its impact on prognosis. We also discussed the importance of ER and PR receptors and their implications to adjuvant treatment options. Prognosis of DCIS dependence on grade, comedo necrosis. It is anticipated that if not treated, 20-30% of DCIS can develop into invasive breast cancer.  Recommendation: Patient plans to do bilateral mastectomies.  Patient is awaiting consultation from plastic surgery. There is no role of radiation or antiestrogen therapy.  Genetics consultation will be requested.  Return to clinic after surgery to discuss the final pathology report  through [place] virtual visit.  All questions were answered. The patient knows to call the clinic with any problems, questions or concerns.   Rulon Eisenmenger, MD, MPH 10/25/2019    I, Molly Dorshimer, am acting as scribe for Nicholas Lose, MD.  I have reviewed the above documentation for accuracy and completeness, and I agree with the above.

## 2019-10-25 ENCOUNTER — Encounter: Payer: Self-pay | Admitting: Licensed Clinical Social Worker

## 2019-10-25 ENCOUNTER — Encounter: Payer: Self-pay | Admitting: *Deleted

## 2019-10-25 ENCOUNTER — Ambulatory Visit: Payer: 59 | Admitting: Licensed Clinical Social Worker

## 2019-10-25 ENCOUNTER — Encounter: Payer: Self-pay | Admitting: Adult Health

## 2019-10-25 ENCOUNTER — Inpatient Hospital Stay: Payer: 59

## 2019-10-25 ENCOUNTER — Other Ambulatory Visit: Payer: Self-pay

## 2019-10-25 ENCOUNTER — Inpatient Hospital Stay: Payer: 59 | Attending: Hematology and Oncology | Admitting: Hematology and Oncology

## 2019-10-25 DIAGNOSIS — D0512 Intraductal carcinoma in situ of left breast: Secondary | ICD-10-CM | POA: Diagnosis not present

## 2019-10-25 DIAGNOSIS — C50412 Malignant neoplasm of upper-outer quadrant of left female breast: Secondary | ICD-10-CM

## 2019-10-25 DIAGNOSIS — Z171 Estrogen receptor negative status [ER-]: Secondary | ICD-10-CM | POA: Diagnosis not present

## 2019-10-25 MED ORDER — MULTIVITAMINS PO CAPS: 1.0000 | ORAL_CAPSULE | Freq: Every day | ORAL | Status: AC

## 2019-10-25 MED ORDER — FISH OIL 1000 MG PO CPDR: 1.0000 | DELAYED_RELEASE_CAPSULE | Freq: Every day | ORAL | Status: AC

## 2019-10-25 NOTE — Assessment & Plan Note (Signed)
10/13/2019:Screening detected left breast calcifications with high breast density, UOQ: 9 x 8 x 5 cm.  Anterior and posterior end of the calcifications were biopsied revealing high-grade DCIS with calcifications ER 0%, PR 0% Tis NX stage 0  Pathology review: I discussed with the patient the difference between DCIS and invasive breast cancer. It is considered a precancerous lesion. DCIS is classified as a 0. It is generally detected through mammograms as calcifications. We discussed the significance of grades and its impact on prognosis. We also discussed the importance of ER and PR receptors and their implications to adjuvant treatment options. Prognosis of DCIS dependence on grade, comedo necrosis. It is anticipated that if not treated, 20-30% of DCIS can develop into invasive breast cancer.  Recommendation: Patient plans to do bilateral mastectomies. There is no role of radiation or antiestrogen therapy.  Return to clinic after surgery to discuss the final pathology report through [place] virtual visit.

## 2019-10-25 NOTE — Progress Notes (Signed)
REFERRING PROVIDER: Nicholas Lose, MD [place],  [place] [id number]  PRIMARY PROVIDER:  Patient, [name]  PRIMARY REASON FOR VISIT:  1. Malignant neoplasm of upper-outer quadrant of left breast in female, estrogen receptor negative ([place])    I connected with Rita Ballard on 10/25/2019 at 3:00 PM EDT by Webex and verified that I am speaking with the correct person using two identifiers.    Patient location: [facility] Provider location: [facility]  HISTORY OF PRESENT ILLNESS:   Rita Ballard, a 44 y.o. female, was seen for a [facility] cancer genetics consultation at the request of Dr. Lindi Adie due to her recent diagnosis of breast cancer.  Rita Ballard presents to clinic today to discuss the possibility of a hereditary predisposition to cancer, genetic testing, and to further clarify her future cancer risks, as well as potential cancer risks for family members.   In 2021, at the age of 85, Rita Ballard was diagnosed with DCIS of the left breast, ER/PR-. The treatment plan includes bilateral mastectomies. She is also planning to have a hysterectomy.   CANCER HISTORY:  Oncology History  Malignant neoplasm of upper-outer quadrant of left breast in female, estrogen receptor negative ([place])  10/13/2019 Cancer Staging   Staging form: Breast, AJCC 8th Edition - Clinical stage from 10/13/2019: Stage 0 (cTis (DCIS), cN0, cM0, ER-, PR-) - Signed by Gardenia Phlegm, NP on 10/25/2019   10/13/2019 Initial Diagnosis   Screening detected left breast calcifications with high breast density, UOQ: 9 x 8 x 5 cm.  Anterior and posterior end of the calcifications were biopsied revealing high-grade DCIS with calcifications ER 0%, PR 0%      RISK FACTORS:  Menarche was at age 56.  First live birth at age 72.  OCP use for approximately 0 years.  Ovaries intact: yes.  Hysterectomy: no.  Menopausal status: premenopausal.  HRT use: 0 years. Colonoscopy: no; not  examined. Mammogram within the last year: yes Number of breast biopsies: 2. Up to date with pelvic exams: yes. Any excessive radiation exposure in the past: no  Past Medical History:  Diagnosis Date  . Hx of endometriosis   . Hx of scoliosis    As a child    Past Surgical History:  Procedure Laterality Date  . CESAREAN SECTION    . WISDOM TOOTH EXTRACTION      Social History   Socioeconomic History  . Marital status: Married    Spouse name: Not on file  . Number of children: Not on file  . Years of education: Not on file  . Highest education level: Not on file  Occupational History  . Not on file  Tobacco Use  . Smoking status: Never Smoker  . Smokeless tobacco: Never Used  Substance and Sexual Activity  . Alcohol use: No  . Drug use: No  . Sexual activity: Not on file  Other Topics Concern  . Not on file  Social History Narrative  . Not on file   Social Determinants of Health   Financial Resource Strain:   . Difficulty of Paying Living Expenses:   Food Insecurity:   . Worried About Charity fundraiser in the Last Year:   . Arboriculturist in the Last Year:   Transportation Needs:   . Film/video editor (Medical):   Marland Kitchen Lack of Transportation (Non-Medical):   Physical Activity:   . Days of Exercise per Week:   . Minutes of Exercise per  Session:   Stress:   . Feeling of Stress :   Social Connections:   . Frequency of Communication with Friends and Family:   . Frequency of Social Gatherings with Friends and Family:   . Attends Religious Services:   . Active Member of Clubs or Organizations:   . Attends Archivist Meetings:   Marland Kitchen Marital Status:      FAMILY HISTORY:  We obtained a detailed, 4-generation family history.  Significant diagnoses are listed below: No family history on file.   Rita Ballard has 2 sons and 2 daughters. She has a twin sister and an older brother, no cancers.   Rita Ballard mother is living at 11 and has never had  cancer. Patient had 2 maternal aunts. No cancers on this side of the family. Maternal grandmother died in her 43s, grandfather died around 71.   Rita Ballard father is living at 49 with no history of cancer. Patient does not have information about his side of the family.   Rita Ballard is unaware of previous family history of genetic testing for hereditary cancer risks. Patient's maternal ancestors are of [place] descent, and paternal ancestors are of [place] descent. There is no reported Ashkenazi Jewish ancestry. There is no known consanguinity.  GENETIC COUNSELING ASSESSMENT: Rita Ballard is a 44 y.o. female with a personal history of DCIS at 73 which is somewhat suggestive of a hereditary cancer syndrome and predisposition to cancer. We, therefore, discussed and recommended the following at today's visit.   DISCUSSION: We discussed that 5 - 10% of breast cancer is hereditary, with most cases associated with BRCA1/BRCA2 mutations.  There are other genes that can be associated with hereditary breast cancer syndromes.  We discussed that testing is beneficial for several reasons including surgical decision-making for breast cancer, knowing how to follow individuals after completing their treatment, and understand if other family members could be at risk for cancer and allow them to undergo genetic testing.   We reviewed the characteristics, features and inheritance patterns of hereditary cancer syndromes. We also discussed genetic testing, including the appropriate family members to test, the process of testing, insurance coverage and turn-around-time for results. We discussed the implications of a negative, positive and/or variant of uncertain significant result. We recommended Rita Ballard pursue genetic testing for the Common Hereditary Cancers gene panel.   The Common Hereditary Cancers Panel offered by [facility] includes sequencing and/or deletion duplication testing of the following 48 genes: APC,  ATM, AXIN2, BARD1, BMPR1A, BRCA1, BRCA2, BRIP1, CDH1, CDKN2A (p14ARF), CDKN2A (p16INK4a), CKD4, CHEK2, CTNNA1, DICER1, EPCAM (Deletion/duplication testing only), GREM1 (promoter region deletion/duplication testing only), KIT, MEN1, MLH1, MSH2, MSH3, MSH6, MUTYH, NBN, NF1, NHTL1, PALB2, PDGFRA, PMS2, POLD1, POLE, PTEN, RAD50, RAD51C, RAD51D, RNF43, SDHB, SDHC, SDHD, SMAD4, SMARCA4. STK11, TP53, TSC1, TSC2, and VHL.  The following genes were evaluated for sequence changes only: SDHA and HOXB13 c.251G>A variant only.  Based on Rita Ballard's personal history of cancer, she meets medical criteria for genetic testing. Despite that she meets criteria, she may still have an out of pocket cost. .    PLAN: After considering the risks, benefits, and limitations, Rita Ballard provided informed consent to pursue genetic testing and the blood sample was sent to [facility] for analysis of the Common Hereditary Cancers Panel. Results should be available within approximately 2-3 weeks' time, at which point they will be disclosed by telephone to Rita Ballard, as will any additional recommendations warranted by these results. Rita Ballard will receive  a summary of her genetic counseling visit and a copy of her results once available. This information will also be available in Epic.   Rita Ballard questions were answered to her satisfaction today. Our contact information was provided should additional questions or concerns arise. Thank you for the referral and allowing [place] to share in the care of your patient.   Faith Rogue, MS, [facility] Genetic Counselor [email or web address] Phone: [phone]  The patient was seen for a total of 20 minutes in face-to-face genetic counseling.  Drs. Magrinat, Lindi Adie and/or Burr Medico were available for discussion regarding this case.   _______________________________________________________________________ For Office Staff:  Number of people involved in session: 1 Was an  Intern/ student involved with case: no

## 2019-10-26 LAB — GENETIC SCREENING ORDER

## 2019-11-02 ENCOUNTER — Other Ambulatory Visit: Payer: Self-pay

## 2019-11-02 ENCOUNTER — Ambulatory Visit: Payer: 59 | Attending: Surgery

## 2019-11-02 DIAGNOSIS — R293 Abnormal posture: Secondary | ICD-10-CM | POA: Insufficient documentation

## 2019-11-02 DIAGNOSIS — Z171 Estrogen receptor negative status [ER-]: Secondary | ICD-10-CM | POA: Diagnosis present

## 2019-11-02 DIAGNOSIS — C50412 Malignant neoplasm of upper-outer quadrant of left female breast: Secondary | ICD-10-CM | POA: Insufficient documentation

## 2019-11-02 NOTE — Therapy (Signed)
[place], [place], [address] Phone: [phone]   Fax:  [phone]  Physical Therapy Evaluation  Patient Details  Name: Rita Ballard MRN: [id number] Date of Birth: July 04, 1976 Referring Provider (PT): Alphonsa Overall MD   Encounter Date: 11/02/2019  PT End of Session - 11/02/19 1127    Visit Number  1    Number of Visits  1    Date for PT Re-Evaluation  11/30/19    PT Start Time  1008    PT Stop Time  1045    PT Time Calculation (min)  37 min    Activity Tolerance  Patient tolerated treatment well    Behavior During Therapy  [facility] for tasks assessed/performed       Past Medical History:  Diagnosis Date  . Hx of endometriosis   . Hx of scoliosis    As a child    Past Surgical History:  Procedure Laterality Date  . CESAREAN SECTION    . WISDOM TOOTH EXTRACTION      There were no vitals filed for this visit.   Subjective Assessment - 11/02/19 1009    Subjective  Pt reports no pain. She states that she is going to speak with [name] the reconstruction surgeon on 11/06/19. She will most likely not have to have any chemotherapy or radiation but will have surgery.    Pertinent History  Malignant neoplasm of upper-outer quadrant of left breast in female, estrogen receptor negative    Patient Stated Goals  To learn what I can and how to keep my mobility.    Currently in Pain?  No/denies    Multiple Pain Sites  No         [facility] PT Assessment - 11/02/19 0001      Assessment   Medical Diagnosis  L breast cancer     Referring Provider (PT)  Alphonsa Overall MD    Hand Dominance  Right    Next MD Visit  11/06/2019    Prior Therapy  none      Precautions   Precautions  Other (comment)    Precaution Comments  L breast cancer       Restrictions   Weight Bearing Restrictions  No      Balance Screen   Has the patient fallen in the past 6 months  No    Has the patient had a decrease in  activity level because of a fear of falling?   No    Is the patient reluctant to leave their home because of a fear of falling?   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      Cognition   Overall Cognitive Status  Within Functional Limits for tasks assessed      Observation/Other Assessments   Observations  +0.3 L Dex score      Posture/Postural Control   Posture/Postural Control  Postural limitations    Postural Limitations  Rounded Shoulders;Forward head      ROM / Strength   AROM / PROM / Strength  AROM      AROM   AROM Assessment Site  Shoulder  Right/Left Shoulder  Right;Left    Right Shoulder Flexion  160 Degrees    Right Shoulder ABduction  175 Degrees    Right Shoulder Internal Rotation  73 Degrees    Right Shoulder External Rotation  93 Degrees    Left Shoulder Flexion  165 Degrees    Left Shoulder ABduction  183 Degrees    Left Shoulder Internal Rotation  73 Degrees    Left Shoulder External Rotation  93 Degrees        LYMPHEDEMA/ONCOLOGY QUESTIONNAIRE - 11/02/19 1024      Treatment   Active Chemotherapy Treatment  No    Past Chemotherapy Treatment  No    Active Radiation Treatment  No    Past Radiation Treatment  No    Current Hormone Treatment  No    Past Hormone Therapy  No      What other symptoms do you have   Are you Having Heaviness or Tightness  No    Are you having Pain  No    Are you having pitting edema  No    Is it Hard or Difficult finding clothes that fit  No    Do you have infections  No      Lymphedema Assessments   Lymphedema Assessments  Upper extremities      Right Upper Extremity Lymphedema   15 cm Proximal to Olecranon Process  32.7 cm    10 cm Proximal to Olecranon  Process  30.4 cm    Olecranon Process  26.4 cm    15 cm Proximal to Ulnar Styloid Process  26.5 cm    10 cm Proximal to Ulnar Styloid Process  23.3 cm    Just Proximal to Ulnar Styloid Process  16 cm    Across Hand at [employer]  20.1 cm    At [place] of 2nd Digit  6 cm      Left Upper Extremity Lymphedema   15 cm Proximal to Olecranon Process  31.5 cm    10 cm Proximal to Olecranon Process  29.9 cm    Olecranon Process  26 cm    15 cm Proximal to Ulnar Styloid Process  26.3 cm    10 cm Proximal to Ulnar Styloid Process  23.2 cm    Just Proximal to Ulnar Styloid Process  16 cm    Across Hand at [employer]  19 cm    At [place] of 2nd Digit  6 cm             Objective measurements completed on examination: See above findings.              PT Education - 11/02/19 1126    Education Details  Pt was educated on post op exercises and when to start those, discussed risk for lymphedema and lymphedema risk reduction practices and on when and how to sign up for the [facility] program.    Person(s) Educated  Patient    Methods  Explanation;Handout;Demonstration    Comprehension  Verbalized understanding          PT Long Term Goals - 11/02/19 1130      PT LONG TERM GOAL #1   Title  Patient will be able to verbalize understanding of pertinent lymphedema risk reduction practices relevant to her diagnosis specifically related to risk reduction precautions    Baseline  pt was provided with information and stated understanding.    Time  1    Period  Days  Status  Achieved    Target Date  11/02/19      PT LONG TERM GOAL #2   Title  Patient will be able to return demonstrate and/or verbalize understanding of the post-op home exercise program related to regaining shoulder range of motion following bil masectomy    Baseline  pt was shown demonstration of exercises and given explanation for why each is important.    Time  1    Period  Days    Status  Achieved    Target Date   11/02/19      PT LONG TERM GOAL #3   Title  Patient will be able to verbalize understanding of the importance of attending the postoperative After Breast Cancer Class for further lymphedema risk reduction education and therapeutic exercise    Baseline  Pt was provided with information and handout    Time  1    Period  Days    Status  Achieved    Target Date  11/02/19             Plan - 11/02/19 1128  Pt currently has not had any surgery and is demonstrating bil shoulder ROM WFL. She has no noted deficits in the shoulders or increased circumferential measurements at BIL. Her LDEX score falls WNL.Patient will follow up at outpatient cancer rehab 3-4 weeks following surgery.  If the patient requires physical therapy at that time, a specific plan will be dictated and sent to the referring physician for approval.The patient was educated today on appropriate basic range of motion exercises to begin post operatively and the importance of attending the After Breast Cancer class following surgery.  Patient was educated today on lymphedema risk reduction practices as it pertains to recommendations that will benefit the patient immediately following surgery.  She verbalized good understanding.  The patient was assessed using the L-Dex machine today to produce a lymphedema index baseline score. The patient will be reassessed on a regular basis (typically every 3 months) to obtain new L-Dex scores. If the score is > 6.5 points away from his/her baseline score indicating onset of subclinical lymphedema, it will be recommended to wear a compression garment for 4 weeks, 12 hours per day and then be reassessed. If the score continues to be > 6.5 points from baseline at reassessment, we will initiate lymphedema treatment. Assessing in this manner has a 95% rate of preventing clinically significant lymphedema.      Stability/Clinical Decision Making  Stable/Uncomplicated    Clinical Decision Making  Low     Rehab Potential  Good    PT Frequency  One time visit    PT Treatment/Interventions  Therapeutic activities;Therapeutic exercise;Neuromuscular re-education;Manual techniques    PT Next Visit Plan  Pt will return if needed.    PT Home Exercise Plan  post op exercises    Consulted and Agree with Plan of Care  Patient       Patient will benefit from skilled therapeutic intervention in order to improve the following deficits and impairments:  Decreased knowledge of precautions, Postural dysfunction  Visit Diagnosis: Malignant neoplasm of upper-outer quadrant of left breast in female, estrogen receptor negative ([place])  Abnormal posture     Problem List Patient Active Problem List   Diagnosis Date Noted  . Malignant neoplasm of upper-outer quadrant of left breast in female, estrogen receptor negative ([place]) 10/25/2019  . SVD (spontaneous vaginal delivery) 01/03/2015  . First degree perineal laceration during delivery 01/03/2015  . Active labor at  term 01/02/2015    Ander Purpura, PT 11/02/2019, 11:34 AM  [place], [place], [address] Phone: [phone]   Fax:  [phone]  Name: Rita Ballard MRN: [id number] Date of Birth: 1975-12-04

## 2019-11-02 NOTE — Patient Instructions (Signed)

## 2019-11-03 ENCOUNTER — Ambulatory Visit: Payer: Self-pay | Admitting: Licensed Clinical Social Worker

## 2019-11-03 ENCOUNTER — Telehealth: Payer: Self-pay | Admitting: Licensed Clinical Social Worker

## 2019-11-03 ENCOUNTER — Encounter: Payer: Self-pay | Admitting: Licensed Clinical Social Worker

## 2019-11-03 DIAGNOSIS — Z1379 Encounter for other screening for genetic and chromosomal anomalies: Secondary | ICD-10-CM

## 2019-11-03 DIAGNOSIS — C50412 Malignant neoplasm of upper-outer quadrant of left female breast: Secondary | ICD-10-CM

## 2019-11-03 NOTE — Progress Notes (Signed)
HPI:  Rita Ballard was previously seen in the [place] clinic due to a personal history of DCIS and concerns regarding a hereditary predisposition to cancer. Please refer to our prior cancer genetics clinic note for more information regarding our discussion, assessment and recommendations, at the time. Rita Ballard recent genetic test results were disclosed to her, as were recommendations warranted by these results. These results and recommendations are discussed in more detail below.  CANCER HISTORY:  Oncology History  Malignant neoplasm of upper-outer quadrant of left breast in female, estrogen receptor negative ([place])  10/13/2019 Cancer Staging   Staging form: Breast, AJCC 8th Edition - Clinical stage from 10/13/2019: Stage 0 (cTis (DCIS), cN0, cM0, ER-, PR-) - Signed by Gardenia Phlegm, NP on 10/25/2019   10/13/2019 Initial Diagnosis   Screening detected left breast calcifications with high breast density, UOQ: 9 x 8 x 5 cm.  Anterior and posterior end of the calcifications were biopsied revealing high-grade DCIS with calcifications ER 0%, PR 0%    Genetic Testing   Negative genetic testing. No pathogenic variants identified on the [facility] Common Hereditary Cancers Panel. VUS in ATM called c.8428A>C identified. The report date is 11/03/2019.   The Common Hereditary Cancers Panel offered by [facility] includes sequencing and/or deletion duplication testing of the following 48 genes: APC, ATM, AXIN2, BARD1, BMPR1A, BRCA1, BRCA2, BRIP1, CDH1, CDKN2A (p14ARF), CDKN2A (p16INK4a), CKD4, CHEK2, CTNNA1, DICER1, EPCAM (Deletion/duplication testing only), GREM1 (promoter region deletion/duplication testing only), KIT, MEN1, MLH1, MSH2, MSH3, MSH6, MUTYH, NBN, NF1, NHTL1, PALB2, PDGFRA, PMS2, POLD1, POLE, PTEN, RAD50, RAD51C, RAD51D, RNF43, SDHB, SDHC, SDHD, SMAD4, SMARCA4. STK11, TP53, TSC1, TSC2, and VHL.  The following genes were evaluated for sequence changes only: SDHA and HOXB13  c.251G>A variant only.     FAMILY HISTORY:  We obtained a detailed, 4-generation family history.  Significant diagnoses are listed below: No family history on file.  Rita Ballard has 2 sons and 2 daughters. She has a twin sister and an older brother, no cancers.   Rita Ballard mother is living at 28 and has never had cancer. Patient had 2 maternal aunts. No cancers on this side of the family. Maternal grandmother died in her 35s, grandfather died around 68.   Rita Ballard father is living at 36 with no history of cancer. Patient does not have information about his side of the family.   Rita Ballard is unaware of previous family history of genetic testing for hereditary cancer risks. Patient's maternal ancestors are of [place] descent, and paternal ancestors are of [place] descent. There is no reported Ashkenazi Jewish ancestry. There is no known consanguinity.  GENETIC TEST RESULTS: Genetic testing reported out on 11/03/2019 through the [facility] Common Hereditary cancer panel found no pathogenic mutations.  The Common Hereditary Cancers Panel offered by [facility] includes sequencing and/or deletion duplication testing of the following 48 genes: APC, ATM, AXIN2, BARD1, BMPR1A, BRCA1, BRCA2, BRIP1, CDH1, CDKN2A (p14ARF), CDKN2A (p16INK4a), CKD4, CHEK2, CTNNA1, DICER1, EPCAM (Deletion/duplication testing only), GREM1 (promoter region deletion/duplication testing only), KIT, MEN1, MLH1, MSH2, MSH3, MSH6, MUTYH, NBN, NF1, NHTL1, PALB2, PDGFRA, PMS2, POLD1, POLE, PTEN, RAD50, RAD51C, RAD51D, RNF43, SDHB, SDHC, SDHD, SMAD4, SMARCA4. STK11, TP53, TSC1, TSC2, and VHL.  The following genes were evaluated for sequence changes only: SDHA and HOXB13 c.251G>A variant only.  The test report has been scanned into EPIC and is located under the Molecular Pathology section of the Results Review tab.  A portion of the result report is included below for  reference.     We discussed with Rita Ballard that because  current genetic testing is not perfect, it is possible there may be a gene mutation in one of these genes that current testing cannot detect, but that chance is small.  We also discussed, that there could be another gene that has not yet been discovered, or that we have not yet tested, that is responsible for the cancer diagnoses in the family. It is also possible there is a hereditary cause for the cancer in the family that Rita Ballard did not inherit and therefore was not identified in her testing.  Therefore, it is important to remain in touch with cancer genetics in the future so that we can continue to offer Rita Ballard the most up to date genetic testing.   Genetic testing did identify a variant of uncertain significance (VUS) was identified in the ATM gene.  At this time, it is unknown if this variant is associated with increased cancer risk or if this is a normal finding, but most variants such as this get reclassified to being inconsequential. It should not be used to make medical management decisions. With time, we suspect the lab will determine the significance of this variant, if any. If we do learn more about it, we will try to contact Rita Ballard to discuss it further. However, it is important to stay in touch with [place] periodically and keep the address and phone number up to date.  ADDITIONAL GENETIC TESTING: We discussed with Rita Ballard that her genetic testing was fairly extensive.  If there are genes identified to increase cancer risk that can be analyzed in the future, we would be happy to discuss and coordinate this testing at that time.    CANCER SCREENING RECOMMENDATIONS: Rita Ballard test result is considered negative (normal).  This means that we have not identified a hereditary cause for her personal history of cancer at this time. Most cancers happen by chance and this negative test suggests that her cancer may fall into this category.    While reassuring, this does not definitively  rule out a hereditary predisposition to cancer. It is still possible that there could be genetic mutations that are undetectable by current technology. There could be genetic mutations in genes that have not been tested or identified to increase cancer risk.  Therefore, it is recommended she continue to follow the cancer management and screening guidelines provided by her oncology and primary healthcare provider.   An individual's cancer risk and medical management are not determined by genetic test results alone. Overall cancer risk assessment incorporates additional factors, including personal medical history, family history, and any available genetic information that may result in a personalized plan for cancer prevention and surveillance.   RECOMMENDATIONS FOR FAMILY MEMBERS:  Relatives in this family might be at some increased risk of developing cancer, over the general population risk, simply due to the family history of cancer.  We recommended female relatives in this family have a yearly mammogram beginning at age 43, or 18 years younger than the earliest onset of cancer, an annual clinical breast exam, and perform monthly breast self-exams. Female relatives in this family should also have a gynecological exam as recommended by their primary provider. All family members should have a colonoscopy by age 24, or as directed by their physicians.  FOLLOW-UP: Lastly, we discussed with Rita Ballard that cancer genetics is a rapidly advancing field and it is possible that new genetic tests will be appropriate  for her and/or her family members in the future. We encouraged her to remain in contact with cancer genetics on an annual basis so we can update her personal and family histories and let her know of advances in cancer genetics that may benefit this family.   Our contact number was provided. Rita Ballard questions were answered to her satisfaction, and she knows she is welcome to call us at anytime with  additional questions or concerns.   Faith Rogue, MS, [facility] Genetic Counselor [email or web address] Phone: [phone]

## 2019-11-03 NOTE — Telephone Encounter (Signed)
Revealed negative genetic testing.  Revealed that a VUS in ATM was identified.  We discussed that we do not know why she has breast cancer or why there is cancer in the family. It could be due to a different gene that we are not testing, or something our current technology cannot pick up.  It will be important for her to keep in contact with genetics to learn if additional testing may be needed in the future.

## 2019-11-06 ENCOUNTER — Ambulatory Visit (INDEPENDENT_AMBULATORY_CARE_PROVIDER_SITE_OTHER): Payer: 59 | Admitting: Plastic Surgery

## 2019-11-06 ENCOUNTER — Other Ambulatory Visit: Payer: Self-pay

## 2019-11-06 ENCOUNTER — Encounter: Payer: Self-pay | Admitting: Plastic Surgery

## 2019-11-06 VITALS — BP 121/78 | HR 73 | Temp 97.9°F | Ht 68.0 in | Wt 173.0 lb

## 2019-11-06 DIAGNOSIS — Z171 Estrogen receptor negative status [ER-]: Secondary | ICD-10-CM

## 2019-11-06 DIAGNOSIS — Z1379 Encounter for other screening for genetic and chromosomal anomalies: Secondary | ICD-10-CM | POA: Diagnosis not present

## 2019-11-06 DIAGNOSIS — C50412 Malignant neoplasm of upper-outer quadrant of left female breast: Secondary | ICD-10-CM

## 2019-11-06 NOTE — Progress Notes (Signed)
Patient ID: Rita Ballard, female    DOB: [birth year], 44 y.o.   MRN: [id number]   Chief Complaint  Patient presents with  . Breast Problem    The patient is a 44 yrs old white female here with her husband for consultation for breast reconstruction.  She went for a routine mammogram and was found to have calcifications of the left breast in the upper outer quadrant extending to the axillary tail.  The pathology was done 10/13/2019 and showed ductal carcinoma in situ with calcifications of the upper outer quadrant, estrogen and progesterone negative.  The patient has decided on bilateral mastectomies.  She is otherwise in very good health.  She is originally from the [place].  [place] for [employer] in [place] area.  She is not planning on any chemo or radiation at this time.  She is not aware of any family history of breast cancer.  She was genetically tested and it came back negative.  She does not smoke.  She is 5 feet 8 inches tall and weighs 173 pounds.  Her preoperative bra size is likely a 34 C (although I think she wears a D cup).  She is planning on having a hysterectomy and would like to do it at the same general time if possible.  She would also like to keep her nipple areola if possible.  After discussion with Dr. Lucia Gaskins the nipple areola would not be wise to keep on the left side.  It is an option on the right side and something for her to think about.  She has 4 kids.  She also mentions that she has severe menorrhagia.  This is part of why she wants to go ahead with a hysterectomy.   Review of Systems  Constitutional: Negative for activity change and appetite change.  HENT: Negative.   Eyes: Negative.   Respiratory: Negative.  Negative for chest tightness.   Cardiovascular: Negative.   Gastrointestinal: Negative.   Genitourinary: Negative.   Musculoskeletal: Negative.  Negative for back pain.  Hematological: Negative.   Psychiatric/Behavioral: Negative.      Past Medical History:  Diagnosis Date  . Hx of endometriosis   . Hx of scoliosis    As a child    Past Surgical History:  Procedure Laterality Date  . CESAREAN SECTION    . WISDOM TOOTH EXTRACTION        Current Outpatient Medications:  Marland Kitchen  Multiple Vitamin (MULTIVITAMIN) capsule, Take 1 capsule by mouth daily., Disp:  , Rfl:  .  Omega-3 Fatty Acids (FISH OIL) 1000 MG CPDR, Take 1 capsule by mouth daily., Disp: , Rfl:    Objective:   Vitals:   11/06/19 0815  BP: 121/78  Pulse: 73  Temp: 97.9 F (36.6 C)  SpO2: 100%    Physical Exam Vitals and nursing note reviewed.  Constitutional:      Appearance: Normal appearance.  HENT:     Head: Normocephalic and atraumatic.  Eyes:     Extraocular Movements: Extraocular movements intact.  Cardiovascular:     Rate and Rhythm: Normal rate.     Pulses: Normal pulses.  Pulmonary:     Effort: Pulmonary effort is normal. No respiratory distress.  Abdominal:     General: Abdomen is flat. There is no distension.     Tenderness: There is no abdominal tenderness.  Skin:    General: Skin is warm.     Capillary Refill: Capillary refill takes less  than 2 seconds.  Neurological:     General: No focal deficit present.     Mental Status: She is alert and oriented to person, place, and time.  Psychiatric:        Mood and Affect: Mood normal.        Behavior: Behavior normal.     Assessment & Plan:  Genetic testing  Malignant neoplasm of upper-outer quadrant of left breast in female, estrogen receptor negative ([place])  We had a detailed conversation about the patient's options for breast reconstruction. Several reconstruction options were explained to the patient.  It is important to remember that breast reconstruction is an optional procedure. Reconstruction often requires several stages of surgery and this means more than one operation.  The surgeries are often done several months apart.  The entire process from start to finish  can take a year or more. The major goal of breast reconstruction is to look normal in clothing. There will always be scars and a difference noticeable without clothes.  This is true for asymmetries where both breasts will not be identical.  Surgery may be needed or desired to the non-cancerous breast in order to achieve better symmetry and satisfactory results.  Regardless of the reconstructive method, there is always risks and the possibility that the procedure will fail or have complications.  This couls required additional surgeries.    We discussed the available methods of breast reconstruction and included:  1. Tissue expander with Acellular dermal matrix followed by implant based reconstruction. This can be done as one surgery or multiple surgeries.  2. Autologous reconstruction can include using a muscle or tissue from another area of the body for the reconstruction.  3. Combined procedures like the latissismus dorsi flaps that often uses the muscle with an expander or implant.  For each of the method discussed the risks, benefits, scars and recovery time were discussed in detail. Specific risks included bleeding, infection, hematoma, seroma, scarring, pain, wound healing complications, flap loss, fat necrosis, capsular contracture, need for implant removal, donor site complications, bulge, hernia, umbilical necrosis, need for urgent reoperation, and need for dressing changes.   After the options were discussed we focused on the patient's desires and the procedure that was best for her based on all the information.  A total of 50 minutes of face-to-face time was spent in this encounter, of which >50% was spent in counseling.   She does not have much of an abdomen that would make 2 breasts the size that she wants.  Her best option is for implant reconstruction.  We talked about staging it and doing it all in 1.  Based on her anatomy I think staging it would be the best option.  She can have expanders  placed at the time of her mastectomies.  She is going to think about whether or not she wants to keep the right nipple areola.  We will send the brochure to her email. I have spoken to Dr. Lucia Gaskins and agree with the above plan.  I have also personally reviewed her records. Request placed for coordinated surgery with Dr. Lucia Gaskins for bilateral immediate breast reconstruction with expanders and Flex HD. Pictures were obtained of the patient and placed in the chart with the patient's or guardian's permission.   [place], DO

## 2019-11-07 NOTE — Progress Notes (Signed)
Error- please disregard

## 2019-11-08 ENCOUNTER — Ambulatory Visit
Admission: RE | Admit: 2019-11-08 | Discharge: 2019-11-08 | Disposition: A | Discharge status: Home / Self Care | Payer: 59 | Source: Ambulatory Visit | Attending: Radiation Oncology | Admitting: Radiation Oncology

## 2019-11-08 ENCOUNTER — Ambulatory Visit: Payer: 59

## 2019-11-16 ENCOUNTER — Encounter: Payer: Self-pay | Admitting: *Deleted

## 2019-11-20 ENCOUNTER — Other Ambulatory Visit (HOSPITAL_COMMUNITY): Payer: Self-pay | Admitting: Surgery

## 2019-11-20 DIAGNOSIS — D0592 Unspecified type of carcinoma in situ of left breast: Secondary | ICD-10-CM

## 2019-11-20 NOTE — Progress Notes (Signed)
Patient ID: Rita Ballard, female    DOB: 1975/12/22, 44 y.o.   MRN: [id number]  Chief Complaint  Patient presents with  . Pre-op Exam    BL immediate breast reconstruction with expander and flex HD      ICD-10-CM   1. Malignant neoplasm of upper-outer quadrant of left breast in female, estrogen receptor negative ([facility])  C50.412    Z17.1   2. Genetic testing  Z13.79     History of Present Illness: Rita Ballard is a 44 y.o.  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  Negative genetic testing.  She presents for preoperative evaluation for upcoming procedure, bilateral breast reconstruction after mastectomy with placement of tissue expanders and Flex HD on 11/30/2019 with Dr. Marla Roe after bilateral mastectomy by Dr. Lucia Gaskins.  Patient is here with her husband today.  The patient has not had problems with anesthesia.  No family or personal history of DVT/PE.  No family or personal history of bleeding or clotting disorders.  Patient is a non-smoker.  Not currently taking any hormone replacement therapy.  Patient is generally healthy, she does not take any chronic medications.  Patient reports that she had recent labs drawn at her PCP I was able to view her CBC through her medical chart with her online, no abnormalities noted.  Past Medical History: Allergies: No Known Allergies  Current Medications:  Current Outpatient Medications:  Marland Kitchen  Multiple Vitamin (MULTIVITAMIN) capsule, Take 1 capsule by mouth daily., Disp:  , Rfl:  .  Omega-3 Fatty Acids (FISH OIL) 1000 MG CPDR, Take 1 capsule by mouth daily., Disp: , Rfl:   Past Medical Problems: Past Medical History:  Diagnosis Date  . Hx of endometriosis   . Hx of scoliosis    As a child    Past Surgical History: Past Surgical History:  Procedure Laterality Date  . CESAREAN SECTION    . WISDOM TOOTH EXTRACTION      Social  History: Social History   Socioeconomic History  . Marital status: Married    Spouse name: Not on file  . Number of children: Not on file  . Years of education: Not on file  . Highest education level: Not on file  Occupational History  . Not on file  Tobacco Use  . Smoking status: Never Smoker  . Smokeless tobacco: Never Used  Substance and Sexual Activity  . Alcohol use: No  . Drug use: No  . Sexual activity: Not on file  Other Topics Concern  . Not on file  Social History Narrative  . Not on file   Social Determinants of Health   Financial Resource Strain:   . Difficulty of Paying Living Expenses:   Food Insecurity:   . Worried About Charity fundraiser in the Last Year:   . Arboriculturist in the Last Year:   Transportation Needs:   . Film/video editor (Medical):   Marland Kitchen Lack of Transportation (Non-Medical):   Physical Activity:   . Days of Exercise per Week:   . Minutes of Exercise per Session:   Stress:   . Feeling of Stress :   Social Connections:   . Frequency of Communication with Friends and Family:   . Frequency of Social Gatherings with Friends and Family:   . Attends Religious Services:   . Active Member of Clubs or Organizations:   .  Attends Archivist Meetings:   Marland Kitchen Marital Status:   Intimate Partner Violence:   . Fear of Current or Ex-Partner:   . Emotionally Abused:   Marland Kitchen Physically Abused:   . Sexually Abused:     Family History: History reviewed. No pertinent family history.  Review of Systems: Review of Systems  Constitutional: Negative.   Respiratory: Negative.   Cardiovascular: Negative.   Gastrointestinal: Negative.   Genitourinary: Negative.   Musculoskeletal: Negative.   Skin: Negative.   Neurological: Negative.     Physical Exam: Vital Signs BP 124/82 (BP Location: Left Arm, Patient Position: Sitting, Cuff Size: Normal)   Pulse 77   Temp 98 F (36.7 C) (Temporal)   Ht 5\' 8"  (1.727 m)   Wt 174 lb (78.9 kg)    SpO2 98%   BMI 26.46 kg/m   Physical Exam Exam conducted with a chaperone present.  Constitutional:      General: She is not in acute distress.    Appearance: Normal appearance. She is not ill-appearing.  HENT:     Head: Normocephalic and atraumatic.  Eyes:     Pupils: Pupils are equal, round Neck:     Musculoskeletal: Normal range of motion.  Cardiovascular:     Rate and Rhythm: Normal rate and regular rhythm.     Pulses: Normal pulses.     Heart sounds: Normal heart sounds. No murmur.  Pulmonary:     Effort: Pulmonary effort is normal. No respiratory distress.     Breath sounds: Normal breath sounds. No wheezing.  Abdominal:     General: Abdomen is flat. There is no distension.     Palpations: Abdomen is soft.     Tenderness: There is no abdominal tenderness.  Musculoskeletal: Normal range of motion.  Skin:    General: Skin is warm and dry.     Findings: No erythema or rash.  Neurological:     General: No focal deficit present.     Mental Status: She is alert and oriented to person, place, and time. Mental status is at baseline.     Motor: No weakness.  Psychiatric:        Mood and Affect: Mood normal.        Behavior: Behavior normal.    Assessment/Plan: The patient is scheduled for bilateral mastectomy, possible right nipple sparing with Dr. Lucia Gaskins followed by immediate bilateral breast reconstruction with placement of tissue expanders and Flex HD on 11/30/2019 with Dr. Marla Roe.  Risks, benefits, questions answered and consent obtained.    Patient provided with general consent form covering surgical risks associated with the above mentionrf surgical intervention and general surgical risks. Patient was allocated adequate time prior to appointment to read through, initial and sign that they agree that they are aware of the risks associated. We also covered the risks in person during this pre-op appointment. I answered all of the patient's questions to their content and  informed them to call with any further questions or concerns prior to surgery and I would be happy to discuss with them further.  Mammogram: Left breast biopsy on 10/13/2019 noted high-grade ductal carcinoma in situ with calcifications of left breast. Scheduled for bilateral mastectomy. Smoking status: Non-smoker Caprini score: High risk, age, length of surgery, BMI greater than 25, breast cancer.  The risks that can be encountered with and after placement of a breast expander placement were discussed and include the following but not limited to these: bleeding, infection, delayed healing, anesthesia risks, skin sensation  changes, injury to structures including nerves, blood vessels, and muscles which may be temporary or permanent, allergies to tape, suture materials and glues, blood products, topical preparations or injected agents, skin contour irregularities, skin discoloration and swelling, deep vein thrombosis, cardiac and pulmonary complications, pain, which may persist, fluid accumulation, wrinkling of the skin over the expander, changes in nipple or breast sensation, expander leakage or rupture, faulty position of the expander, persistent pain, formation of tight scar tissue around the expander (capsular contracture), possible need for revisional surgery or staged procedures.  Prescription sent to pharmacy   Electronically signed by: Carola Rhine Ruey Storer, PA-C 11/21/2019 10:10 AM

## 2019-11-20 NOTE — H&P (View-Only) (Signed)
Patient ID: Rita Ballard, female    DOB: [birth year], 44 y.o.   MRN: [id number]  Chief Complaint  Patient presents with  . Pre-op Exam    BL immediate breast reconstruction with expander and flex HD      ICD-10-CM   1. Malignant neoplasm of upper-outer quadrant of left breast in female, estrogen receptor negative ([facility])  C50.412    Z17.1   2. Genetic testing  Z13.79     History of Present Illness: Rita Ballard is a 44 y.o.  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  Negative genetic testing.  She presents for preoperative evaluation for upcoming procedure, bilateral breast reconstruction after mastectomy with placement of tissue expanders and Flex HD on 11/30/2019 with Dr. Marla Roe after bilateral mastectomy by Dr. Lucia Gaskins.  Patient is here with her husband today.  The patient has not had problems with anesthesia.  No family or personal history of DVT/PE.  No family or personal history of bleeding or clotting disorders.  Patient is a non-smoker.  Not currently taking any hormone replacement therapy.  Patient is generally healthy, she does not take any chronic medications.  Patient reports that she had recent labs drawn at her PCP I was able to view her CBC through her medical chart with her online, no abnormalities noted.  Past Medical History: Allergies: No Known Allergies  Current Medications:  Current Outpatient Medications:  Marland Kitchen  Multiple Vitamin (MULTIVITAMIN) capsule, Take 1 capsule by mouth daily., Disp:  , Rfl:  .  Omega-3 Fatty Acids (FISH OIL) 1000 MG CPDR, Take 1 capsule by mouth daily., Disp: , Rfl:   Past Medical Problems: Past Medical History:  Diagnosis Date  . Hx of endometriosis   . Hx of scoliosis    As a child    Past Surgical History: Past Surgical History:  Procedure Laterality Date  . CESAREAN SECTION    . WISDOM TOOTH EXTRACTION      Social  History: Social History   Socioeconomic History  . Marital status: Married    Spouse name: Not on file  . Number of children: Not on file  . Years of education: Not on file  . Highest education level: Not on file  Occupational History  . Not on file  Tobacco Use  . Smoking status: Never Smoker  . Smokeless tobacco: Never Used  Substance and Sexual Activity  . Alcohol use: No  . Drug use: No  . Sexual activity: Not on file  Other Topics Concern  . Not on file  Social History Narrative  . Not on file   Social Determinants of Health   Financial Resource Strain:   . Difficulty of Paying Living Expenses:   Food Insecurity:   . Worried About Charity fundraiser in the Last Year:   . Arboriculturist in the Last Year:   Transportation Needs:   . Film/video editor (Medical):   Marland Kitchen Lack of Transportation (Non-Medical):   Physical Activity:   . Days of Exercise per Week:   . Minutes of Exercise per Session:   Stress:   . Feeling of Stress :   Social Connections:   . Frequency of Communication with Friends and Family:   . Frequency of Social Gatherings with Friends and Family:   . Attends Religious Services:   . Active Member of Clubs or Organizations:   .  Attends Archivist Meetings:   Marland Kitchen Marital Status:   Intimate Partner Violence:   . Fear of Current or Ex-Partner:   . Emotionally Abused:   Marland Kitchen Physically Abused:   . Sexually Abused:     Family History: History reviewed. No pertinent family history.  Review of Systems: Review of Systems  Constitutional: Negative.   Respiratory: Negative.   Cardiovascular: Negative.   Gastrointestinal: Negative.   Genitourinary: Negative.   Musculoskeletal: Negative.   Skin: Negative.   Neurological: Negative.     Physical Exam: Vital Signs BP 124/82 (BP Location: Left Arm, Patient Position: Sitting, Cuff Size: Normal)   Pulse 77   Temp 98 F (36.7 C) (Temporal)   Ht 5\' 8"  (1.727 m)   Wt 174 lb (78.9 kg)    SpO2 98%   BMI 26.46 kg/m   Physical Exam Exam conducted with a chaperone present.  Constitutional:      General: She is not in acute distress.    Appearance: Normal appearance. She is not ill-appearing.  HENT:     Head: Normocephalic and atraumatic.  Eyes:     Pupils: Pupils are equal, round Neck:     Musculoskeletal: Normal range of motion.  Cardiovascular:     Rate and Rhythm: Normal rate and regular rhythm.     Pulses: Normal pulses.     Heart sounds: Normal heart sounds. No murmur.  Pulmonary:     Effort: Pulmonary effort is normal. No respiratory distress.     Breath sounds: Normal breath sounds. No wheezing.  Abdominal:     General: Abdomen is flat. There is no distension.     Palpations: Abdomen is soft.     Tenderness: There is no abdominal tenderness.  Musculoskeletal: Normal range of motion.  Skin:    General: Skin is warm and dry.     Findings: No erythema or rash.  Neurological:     General: No focal deficit present.     Mental Status: She is alert and oriented to person, place, and time. Mental status is at baseline.     Motor: No weakness.  Psychiatric:        Mood and Affect: Mood normal.        Behavior: Behavior normal.    Assessment/Plan: The patient is scheduled for bilateral mastectomy, possible right nipple sparing with Dr. Lucia Gaskins followed by immediate bilateral breast reconstruction with placement of tissue expanders and Flex HD on 11/30/2019 with Dr. Marla Roe.  Risks, benefits, questions answered and consent obtained.    Patient provided with general consent form covering surgical risks associated with the above mentionrf surgical intervention and general surgical risks. Patient was allocated adequate time prior to appointment to read through, initial and sign that they agree that they are aware of the risks associated. We also covered the risks in person during this pre-op appointment. I answered all of the patient's questions to their content and  informed them to call with any further questions or concerns prior to surgery and I would be happy to discuss with them further.  Mammogram: Left breast biopsy on 10/13/2019 noted high-grade ductal carcinoma in situ with calcifications of left breast. Scheduled for bilateral mastectomy. Smoking status: Non-smoker Caprini score: High risk, age, length of surgery, BMI greater than 25, breast cancer.  The risks that can be encountered with and after placement of a breast expander placement were discussed and include the following but not limited to these: bleeding, infection, delayed healing, anesthesia risks, skin sensation  changes, injury to structures including nerves, blood vessels, and muscles which may be temporary or permanent, allergies to tape, suture materials and glues, blood products, topical preparations or injected agents, skin contour irregularities, skin discoloration and swelling, deep vein thrombosis, cardiac and pulmonary complications, pain, which may persist, fluid accumulation, wrinkling of the skin over the expander, changes in nipple or breast sensation, expander leakage or rupture, faulty position of the expander, persistent pain, formation of tight scar tissue around the expander (capsular contracture), possible need for revisional surgery or staged procedures.  Prescription sent to pharmacy   Electronically signed by: Carola Rhine Pauline Pegues, PA-C 11/21/2019 10:10 AM

## 2019-11-21 ENCOUNTER — Other Ambulatory Visit: Payer: Self-pay

## 2019-11-21 ENCOUNTER — Ambulatory Visit (INDEPENDENT_AMBULATORY_CARE_PROVIDER_SITE_OTHER): Payer: 59 | Admitting: Surgical

## 2019-11-21 ENCOUNTER — Encounter: Payer: Self-pay | Admitting: Surgical

## 2019-11-21 VITALS — BP 124/82 | HR 77 | Temp 98.0°F | Ht 68.0 in | Wt 174.0 lb

## 2019-11-21 DIAGNOSIS — Z1379 Encounter for other screening for genetic and chromosomal anomalies: Secondary | ICD-10-CM

## 2019-11-21 DIAGNOSIS — C50412 Malignant neoplasm of upper-outer quadrant of left female breast: Secondary | ICD-10-CM

## 2019-11-21 DIAGNOSIS — Z171 Estrogen receptor negative status [ER-]: Secondary | ICD-10-CM

## 2019-11-21 MED ORDER — HYDROCODONE-ACETAMINOPHEN 5-325 MG PO TABS: 1.0000 | ORAL_TABLET | Freq: Four times a day (QID) | ORAL | 0 refills | Status: AC | PRN

## 2019-11-21 MED ORDER — ONDANSETRON HCL 4 MG PO TABS: 4.0000 mg | ORAL_TABLET | Freq: Three times a day (TID) | ORAL | 0 refills | Status: DC | PRN

## 2019-11-21 MED ORDER — DIAZEPAM 2 MG PO TABS: 2.0000 mg | ORAL_TABLET | Freq: Two times a day (BID) | ORAL | 0 refills | Status: DC | PRN

## 2019-11-21 MED ORDER — CEPHALEXIN 500 MG PO CAPS
500.0000 mg | ORAL_CAPSULE | Freq: Four times a day (QID) | ORAL | 0 refills | Status: AC
Start: 2019-11-21 — End: 2019-11-26

## 2019-11-23 ENCOUNTER — Other Ambulatory Visit: Payer: Self-pay | Admitting: Surgical

## 2019-11-23 ENCOUNTER — Encounter: Payer: Self-pay | Admitting: *Deleted

## 2019-11-23 DIAGNOSIS — C50412 Malignant neoplasm of upper-outer quadrant of left female breast: Secondary | ICD-10-CM

## 2019-11-24 ENCOUNTER — Telehealth: Payer: Self-pay | Admitting: Hematology and Oncology

## 2019-11-24 ENCOUNTER — Encounter (HOSPITAL_COMMUNITY): Payer: Self-pay

## 2019-11-24 NOTE — Telephone Encounter (Signed)
Called pt per 5/13 sch message - per pt no schedule needed.

## 2019-11-24 NOTE — Progress Notes (Signed)
Patient denies shortness of breath, fever, cough or chest pain.  PCP [facility] Cardiologist - n/a Oncology-Dr Nicholas Lose  Chest x-ray - n/a EKG -n/a  Stress Test - n/a ECHO - n/a Cardiac Cath - n./a  ERAS:  Clears til 0400 DOS, no drink.  Anesthesia review: No  STOP now taking any Aspirin (unless otherwise instructed by your surgeon), Aleve, Naproxen, Ibuprofen, Motrin, Advil, Goody's, BC's, all herbal medications, fish oil, and all vitamins.   Coronavirus Screening Covid test schedule on 11/27/19 at 0920. Do you have any of the following symptoms:  Cough yes/no: No Fever (>100.43F)  yes/no: No Runny nose yes/no: No Sore throat yes/no: No Difficulty breathing/shortness of breath  yes/no: No  Have you traveled in the last 14 days and where? yes/no: No  Patient verbalized understanding of instructions that were given to them at the PAT appointment.

## 2019-11-24 NOTE — Progress Notes (Signed)
[id number] - [place], [place] - [address] [place] [address] Phone: [phone] Fax: [phone]    Your procedure is scheduled on Thursday, 11/30/19.  Report to [facility] Main Entrance "A" at 5:30 A.M., and check in at the Admitting office.  Call this number if you have problems the morning of surgery:  [phone]    Remember:  Do not eat after midnight Wednesday the night before your surgery  You may drink clear liquids until 4 AM Thursday morning of your surgery.   Clear liquids allowed are: Water, Non-Citrus Juices (without pulp), Carbonated Beverages, Clear Tea, Black Coffee Only, and Gatorade    Take these medicines the morning of surgery with A SIP OF WATER None  As of today, STOP taking any Aspirin (unless otherwise instructed by your surgeon) and Aspirin containing products, Aleve, Naproxen, Ibuprofen, Motrin, Advil, Goody's, BC's, all herbal medications, fish oil, and all vitamins.                      Do not wear jewelry, make up, or nail polish            Do not wear lotions, powders, perfumes, or deodorant.            Do not shave 48 hours prior to surgery. .            Do not bring valuables to the hospital.            [facility] is not responsible for any belongings or valuables.  Do NOT Smoke (Tobacco/Vapping) or drink Alcohol 24 hours prior to your procedure  Contacts, glasses, dentures or bridgework may not be worn into surgery.      For patients admitted to the hospital, discharge time will be determined by your treatment team.   Patients discharged the day of surgery will not be allowed to drive home, and someone needs to stay with them for 24 hours.   Special instructions:   [facility]- Preparing For Surgery  Before surgery, you can play an important role. Because skin is not sterile, your skin needs to be as free of germs as possible. You can reduce the number of germs on your skin by washing  with CHG (chlorahexidine gluconate) Soap before surgery.  CHG is an antiseptic cleaner which kills germs and bonds with the skin to continue killing germs even after washing.    Oral Hygiene is also important to reduce your risk of infection.  Remember - BRUSH YOUR TEETH THE MORNING OF SURGERY WITH YOUR REGULAR TOOTHPASTE  Please do not use if you have an allergy to CHG or antibacterial soaps. If your skin becomes reddened/irritated stop using the CHG.  Do not shave (including legs and underarms) for at least 48 hours prior to first CHG shower. It is OK to shave your face.  Please follow these instructions carefully.   1. Shower the NIGHT BEFORE SURGERY Wed and the MORNING OF SURGERY Thurs with CHG Soap.   2. If you chose to wash your hair, wash your hair first as usual with your normal shampoo.  3. After you shampoo, rinse your hair and body thoroughly to remove the shampoo.  4. Use CHG as you would any other liquid soap. You can apply CHG directly to the skin and wash gently with a scrungie or a clean washcloth.   5. Apply the CHG Soap to your body ONLY FROM  THE NECK DOWN.  Do not use on open wounds or open sores. Avoid contact with your eyes, ears, mouth and genitals (private parts). Wash Face and genitals (private parts)  with your normal soap.   6. Wash thoroughly, paying special attention to the area where your surgery will be performed.  7. Thoroughly rinse your body with warm water from the neck down.  8. DO NOT shower/wash with your normal soap after using and rinsing off the CHG Soap.  9. Pat yourself dry with a CLEAN TOWEL.  10. Wear CLEAN PAJAMAS to bed the night before surgery, wear comfortable clothes the morning of surgery  11. Place CLEAN SHEETS on your bed the night of your first shower and DO NOT SLEEP WITH PETS.   Day of Surgery:   Do not apply any deodorants/lotions.  Please wear clean clothes to the hospital/surgery center.   Remember to brush your teeth WITH  YOUR REGULAR TOOTHPASTE.   Please read over the following fact sheets that you were given.

## 2019-11-27 ENCOUNTER — Other Ambulatory Visit (HOSPITAL_COMMUNITY)
Admission: RE | Admit: 2019-11-27 | Discharge: 2019-11-27 | Disposition: A | Discharge status: Home / Self Care | Payer: 59 | Source: Ambulatory Visit | Attending: Surgery | Admitting: Surgery

## 2019-11-27 ENCOUNTER — Encounter (HOSPITAL_COMMUNITY): Payer: Self-pay

## 2019-11-27 ENCOUNTER — Encounter (HOSPITAL_COMMUNITY)
Admission: RE | Admit: 2019-11-27 | Discharge: 2019-11-27 | Disposition: A | Discharge status: Home / Self Care | Payer: 59 | Source: Ambulatory Visit | Attending: Surgery | Admitting: Surgery

## 2019-11-27 ENCOUNTER — Ambulatory Visit: Payer: 59

## 2019-11-27 ENCOUNTER — Ambulatory Visit
Admission: RE | Admit: 2019-11-27 | Discharge: 2019-11-27 | Disposition: A | Discharge status: Home / Self Care | Payer: 59 | Source: Ambulatory Visit | Attending: Surgical | Admitting: Surgical

## 2019-11-27 ENCOUNTER — Other Ambulatory Visit: Payer: Self-pay

## 2019-11-27 DIAGNOSIS — Z171 Estrogen receptor negative status [ER-]: Secondary | ICD-10-CM

## 2019-11-27 DIAGNOSIS — Z01812 Encounter for preprocedural laboratory examination: Secondary | ICD-10-CM | POA: Insufficient documentation

## 2019-11-27 DIAGNOSIS — Z20822 Contact with and (suspected) exposure to covid-19: Secondary | ICD-10-CM | POA: Insufficient documentation

## 2019-11-27 HISTORY — DX: Personal history of other diseases of the female genital tract: Z87.42

## 2019-11-27 HISTORY — DX: Other complications of anesthesia, initial encounter: T88.59XA

## 2019-11-27 LAB — SARS CORONAVIRUS 2 (TAT 6-24 HRS): SARS Coronavirus 2: NEGATIVE

## 2019-11-27 LAB — CBC
HCT: 42 % (ref 36.0–46.0)
Hemoglobin: 13.5 g/dL (ref 12.0–15.0)
MCH: 29.5 pg (ref 26.0–34.0)
MCHC: 32.1 g/dL (ref 30.0–36.0)
MCV: 91.9 fL (ref 80.0–100.0)
Platelets: 296 10*3/uL (ref 150–400)
RBC: 4.57 MIL/uL (ref 3.87–5.11)
RDW: 12.2 % (ref 11.5–15.5)
WBC: 6.3 10*3/uL (ref 4.0–10.5)
nRBC: 0 % (ref 0.0–0.2)

## 2019-11-28 ENCOUNTER — Encounter: Payer: Self-pay | Admitting: *Deleted

## 2019-11-28 ENCOUNTER — Telehealth: Payer: Self-pay | Admitting: *Deleted

## 2019-11-28 NOTE — Telephone Encounter (Signed)
Spoke to pt concerning post op with Dr. Lindi Adie. Scheduled and confirmed 12/12/19 at 1130.

## 2019-11-29 NOTE — Anesthesia Preprocedure Evaluation (Addendum)
Anesthesia Evaluation  Patient identified by MRN, date of birth, ID band Patient awake    Reviewed: Allergy & Precautions, NPO status , Patient's Chart, lab work & pertinent test results  Airway Mallampati: II  TM Distance: >3 FB Neck ROM: Full    Dental no notable dental hx. (+) Teeth Intact, Dental Advisory Given   Pulmonary neg pulmonary ROS,    Pulmonary exam normal breath sounds clear to auscultation       Cardiovascular negative cardio ROS Normal cardiovascular exam Rhythm:Regular Rate:Normal     Neuro/Psych Anxiety negative neurological ROS  negative psych ROS   GI/Hepatic negative GI ROS, Neg liver ROS,   Endo/Other  negative endocrine ROS  Renal/GU negative Renal ROS  negative genitourinary   Musculoskeletal negative musculoskeletal ROS (+)   Abdominal   Peds  Hematology negative hematology ROS (+)   Anesthesia Other Findings Left breast CA  Reproductive/Obstetrics                            Anesthesia Physical Anesthesia Plan  ASA: II  Anesthesia Plan: General and Regional   Post-op Pain Management:  Regional for Post-op pain   Induction: Intravenous  PONV Risk Score and Plan: 3 and Midazolam, Dexamethasone and Ondansetron  Airway Management Planned: Oral ETT  Additional Equipment:   Intra-op Plan:   Post-operative Plan: Extubation in OR  Informed Consent: I have reviewed the patients History and Physical, chart, labs and discussed the procedure including the risks, benefits and alternatives for the proposed anesthesia with the patient or authorized representative who has indicated his/her understanding and acceptance.     Dental advisory given  Plan Discussed with: CRNA  Anesthesia Plan Comments:         Anesthesia Quick Evaluation

## 2019-11-29 NOTE — H&P (Signed)
Rita Ballard  Location: [facility] Patient #: [id number] DOB: March 27, 1976 Married / Language: English / Race: White Female  History of Present Illness   The patient is a 44 year old female who presents with a complaint of breast cancer.  Her PCP is Dr. Katharine Look Rivard (she is also seen at [facility] - but has no one person she sees)  She is accompanied by her husband, Rodney Cruise  She had a mammogram a year ago that was negative. She is from the [place] and does not have a solid grasp of her family history. She said that they don't do screening mammograms much, but she knows of no family member who had breast ca. She still has regular periods. But she has endometriosis with a lot of blood loss during her periods. She has been discussed with Dr. Cletis Media about getting a hysterectomy and some time. She has already made al decision that she wants bilateral mastectomies.  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 The total span is 9.0 x 5.0 x 8.0 cm. Biopsy: She had a biopsy in UOQ of the left breast on 10/13/2019 which show 1. posterior aspect of UOQ - DCIS, 2. anterior aspect of UOQ - DCIS, ER - 0%, PR - 0% [there is a comment from radiology that if breast conservation done to "the calcifications in the axillary tail of the breast should be included in the bracketing] Family history of breast or ovarian cancer: No On hormone therapy: No  I discussed the options for breast cancer treatment with the patient. I discussed the multidisciplinary approach to breast cancer which includes medical oncology and radiation oncology. I discussed the surgical options of lumpectomy vs. mastectomy. If mastectomy, there is the possibility of reconstruction. I discussed the options of lymph node biopsy. The treatment plan depends on the pathologic staging of the tumor and the patient's  personal wishes. The risks of surgery include, but are not limited to, bleeding, infection, the need for further surgery, and nerve injury. The patient has been given literature on the treatment of breast cancer. I told her that there is no clear reason to do a prophylactic mastectomy on the right, but she is surprised by the size of this cancer - and worried that the same will happen in the opposite breast.  Plan: 1. Medical and radiation oncology consultation, 2. Plastic surgery for consideration of bilateral breast reconstruction, 3. Bilateral mastectomies (right prophylactic mastectomy) with left axillary sentinel lymph node biopsy, 4. PT to see  Past Medical History: 1. Endometriosis - Dr. Cletis Media plans a hysterectomy at some time 2. C section with her first child  Social History: She is accompanied by her husband, Rodney Cruise She has 4 children: 91 yo daughter, 69 yo son , 65 yo daughter, and 10 yo son She works for [employer] [place] She is originally from the [place]  I have personally seen and evaluated the patient, evaluated laboratory and imaging results, formulated the assessment and plan and placed orders. This requires moderate/high medical decision making. Total time spent with patient and charting: 60 minutes   Past Surgical History ([place], [place]; 10/20/2019 8:53 AM) Breast Biopsy  Left. Cesarean Section - 1   Diagnostic Studies History (Chanel Teressa Senter, CMA; 10/20/2019 8:53 AM) Colonoscopy  never Mammogram  within last year Pap Smear  1-5 years ago  Allergies (Chanel Teressa Senter, CMA; 10/20/2019 8:54 AM) No Known Allergies  [10/20/2019]: No Known  Drug Allergies  [10/20/2019]: Allergies Reconciled   Medication History (Chanel Teressa Senter, [place]; 10/20/2019 8:54 AM) No Current Medications Medications Reconciled  Social History (Chanel Teressa Senter, CMA; 10/20/2019 8:53 AM) Caffeine use  Carbonated beverages, Coffee, Tea. No alcohol use  No drug use   Tobacco use  Never smoker.  Family History Antonietta Jewel, [place]; 10/20/2019 8:53 AM) Hypertension  Mother.  Pregnancy / Birth History Antonietta Jewel, CMA; 10/20/2019 8:53 AM) Age at menarche  70 years. Gravida  4 Length (months) of breastfeeding  3-6 Maternal age  108-25 Para  4 Regular periods   Other Problems (Chanel Teressa Senter, CMA; 10/20/2019 8:53 AM) Breast Cancer     Review of Systems (Chanel Nolan CMA; 10/20/2019 8:53 AM) General Present- Fatigue and Weight Gain. Not Present- Appetite Loss, Chills, Fever, Night Sweats and Weight Loss. Skin Not Present- Change in Wart/Mole, Dryness, Hives, Jaundice, New Lesions, Non-Healing Wounds, Rash and Ulcer. HEENT Not Present- Earache, Hearing Loss, Hoarseness, Nose Bleed, Oral Ulcers, Ringing in the Ears, Seasonal Allergies, Sinus Pain, Sore Throat, Visual Disturbances, Wears glasses/contact lenses and Yellow Eyes. Respiratory Not Present- Bloody sputum, Chronic Cough, Difficulty Breathing, Snoring and Wheezing. Breast Not Present- Breast Mass, Breast Pain, Nipple Discharge and Skin Changes. Cardiovascular Not Present- Chest Pain, Difficulty Breathing Lying Down, Leg Cramps, Palpitations, Rapid Heart Rate, Shortness of Breath and Swelling of Extremities. Gastrointestinal Not Present- Abdominal Pain, Bloating, Bloody Stool, Change in Bowel Habits, Chronic diarrhea, Constipation, Difficulty Swallowing, Excessive gas, Gets full quickly at meals, Hemorrhoids, Indigestion, Nausea, Rectal Pain and Vomiting. Female Genitourinary Not Present- Frequency, Nocturia, Painful Urination, Pelvic Pain and Urgency. Musculoskeletal Not Present- Back Pain, Joint Pain, Joint Stiffness, Muscle Pain, Muscle Weakness and Swelling of Extremities. Neurological Not Present- Decreased Memory, Fainting, Headaches, Numbness, Seizures, Tingling, Tremor, Trouble walking and Weakness. Psychiatric Not Present- Anxiety, Bipolar, Change in Sleep Pattern, Depression, Fearful and  Frequent crying. Endocrine Not Present- Cold Intolerance, Excessive Hunger, Hair Changes, Heat Intolerance, Hot flashes and New Diabetes. Hematology Not Present- Blood Thinners, Easy Bruising, Excessive bleeding, Gland problems, HIV and Persistent Infections.  Vitals (Chanel Nolan CMA; 10/20/2019 8:54 AM) 10/20/2019 8:54 AM Weight: 178.5 lb Height: 68in Body Surface Area: 1.95 m Body Mass Index: 27.14 kg/m  Temp.: 98.65F  Pulse: 88 (Regular)  BP: 126/76(Sitting, Left Arm, Standard)  Physical Exam  General: WN WF who isalert and generally healthy appearing. She is wearing a mask. Skin: Inspection and palpation of the skin unremarkable.  Eyes: Conjunctivae white, pupils equal. Face, ears, nose, mouth, and throat: Face - wearing a mask.  Neck: Supple. No mass. Trachea midline. No thyroid mass.  Lymph Nodes: No supraclavicular or cervical adenopathy. No axillary adenopathy.  Lungs: Normal respiratory effort. Clear to auscultation and symmetric breath sounds. Cardiovascular: Regular rate and rhythm. Normal auscultation of the heart. No murmur or rub.  Breast: Right: No mass or nodule.  Left - fullness in the UOQ of the left breast (bruising from biopsies?)  Abdomen: Soft. No mass. Liver and spleen not palpable. No tenderness. No hernia. Normal bowel sounds. No abdominal scars. Rectal: Not done.  Musculoskeletal/extremities: Normal gait. Good strength and ROM in upper and lower extremities.   Neurologic: Grossly intact to motor and sensory function.   Psychiatric: Has normal mood and affect. Judgement and insight appear normal.   Assessment & Plan  1.  BREAST CANCER, STAGE 0, LEFT (D05.92) Plan:  1. Medical and radiation oncology consultation   She saw Dr. Lindi Adie  2. Plastic surgery for consideration of bilateral breast reconstruction  Dr. Marla Roe will do reconstruction  3. Bilateral mastectomies (right prophylactic  mastectomy) with left axillary sentinel lymph node biopsy   4. PT to see   5. Genetics consultation   Negative (she does have a VUS)  2. Endometriosis - Dr. Cletis Media plans a hysterectomy at some time  Alphonsa Overall, MD, [facility] Surgery Office phone:  [phone]

## 2019-11-30 ENCOUNTER — Encounter (HOSPITAL_COMMUNITY)
Admission: RE | Disposition: A | Discharge status: Home / Self Care | Payer: Self-pay | Source: Home / Self Care | Attending: Plastic Surgery

## 2019-11-30 ENCOUNTER — Other Ambulatory Visit: Payer: Self-pay

## 2019-11-30 ENCOUNTER — Ambulatory Visit (HOSPITAL_COMMUNITY): Payer: 59 | Admitting: Certified Registered"

## 2019-11-30 ENCOUNTER — Observation Stay (HOSPITAL_COMMUNITY)
Admission: RE | Admit: 2019-11-30 | Discharge: 2019-12-01 | Disposition: A | Discharge status: Home / Self Care | Payer: 59 | Attending: Plastic Surgery | Admitting: Plastic Surgery

## 2019-11-30 ENCOUNTER — Encounter (HOSPITAL_COMMUNITY): Payer: Self-pay | Admitting: Surgery

## 2019-11-30 ENCOUNTER — Ambulatory Visit (HOSPITAL_COMMUNITY)
Admission: RE | Admit: 2019-11-30 | Discharge: 2019-11-30 | Disposition: A | Discharge status: Home / Self Care | Payer: 59 | Source: Ambulatory Visit | Attending: Surgery | Admitting: Surgery

## 2019-11-30 DIAGNOSIS — Z171 Estrogen receptor negative status [ER-]: Secondary | ICD-10-CM | POA: Insufficient documentation

## 2019-11-30 DIAGNOSIS — C50412 Malignant neoplasm of upper-outer quadrant of left female breast: Principal | ICD-10-CM | POA: Insufficient documentation

## 2019-11-30 DIAGNOSIS — C50919 Malignant neoplasm of unspecified site of unspecified female breast: Secondary | ICD-10-CM | POA: Diagnosis present

## 2019-11-30 DIAGNOSIS — D0592 Unspecified type of carcinoma in situ of left breast: Secondary | ICD-10-CM

## 2019-11-30 HISTORY — PX: MASTECTOMY W/ SENTINEL NODE BIOPSY: SHX2001

## 2019-11-30 HISTORY — PX: BREAST RECONSTRUCTION WITH PLACEMENT OF TISSUE EXPANDER AND FLEX HD (ACELLULAR HYDRATED DERMIS): SHX6295

## 2019-11-30 LAB — POCT PREGNANCY, URINE: Preg Test, Ur: NEGATIVE

## 2019-11-30 SURGERY — MASTECTOMY WITH SENTINEL LYMPH NODE BIOPSY
Anesthesia: Regional | Site: Breast | Laterality: Bilateral

## 2019-11-30 MED ORDER — ROPIVACAINE HCL 5 MG/ML IJ SOLN
INTRAMUSCULAR | Status: DC | PRN
Start: 2019-11-30 — End: 2019-11-30
  Administered 2019-11-30: 25 mL via PERINEURAL
  Administered 2019-11-30: 20 mL via PERINEURAL

## 2019-11-30 MED ORDER — TECHNETIUM TC 99M SULFUR COLLOID FILTERED
1.0000 | Freq: Once | INTRAVENOUS | Status: AC | PRN
  Administered 2019-11-30: 1 via INTRADERMAL

## 2019-11-30 MED ORDER — MIDAZOLAM HCL 2 MG/2ML IJ SOLN
INTRAMUSCULAR | Status: AC
  Filled 2019-11-30: qty 2

## 2019-11-30 MED ORDER — ACETAMINOPHEN 500 MG PO TABS
1000.0000 mg | ORAL_TABLET | ORAL | Status: AC
  Administered 2019-11-30: 1000 mg via ORAL
  Filled 2019-11-30: qty 2

## 2019-11-30 MED ORDER — ONDANSETRON 4 MG PO TBDP: 4.0000 mg | ORAL_TABLET | Freq: Four times a day (QID) | ORAL | Status: DC | PRN

## 2019-11-30 MED ORDER — ROCURONIUM BROMIDE 10 MG/ML (PF) SYRINGE
PREFILLED_SYRINGE | INTRAVENOUS | Status: DC | PRN
  Administered 2019-11-30: 80 mg via INTRAVENOUS
  Administered 2019-11-30: 20 mg via INTRAVENOUS

## 2019-11-30 MED ORDER — FENTANYL CITRATE (PF) 250 MCG/5ML IJ SOLN
INTRAMUSCULAR | Status: AC
  Filled 2019-11-30: qty 5

## 2019-11-30 MED ORDER — HYDROCODONE-ACETAMINOPHEN 5-325 MG PO TABS
1.0000 | ORAL_TABLET | ORAL | Status: DC | PRN
  Administered 2019-11-30: 1 via ORAL
  Filled 2019-11-30: qty 1

## 2019-11-30 MED ORDER — MIDAZOLAM HCL 2 MG/2ML IJ SOLN
INTRAMUSCULAR | Status: DC | PRN
  Administered 2019-11-30: 2 mg via INTRAVENOUS

## 2019-11-30 MED ORDER — CHLORHEXIDINE GLUCONATE CLOTH 2 % EX PADS: 6.0000 | MEDICATED_PAD | Freq: Once | CUTANEOUS | Status: DC

## 2019-11-30 MED ORDER — EPHEDRINE SULFATE-NACL 50-0.9 MG/10ML-% IV SOSY
PREFILLED_SYRINGE | INTRAVENOUS | Status: DC | PRN
  Administered 2019-11-30: 10 mg via INTRAVENOUS

## 2019-11-30 MED ORDER — SUGAMMADEX SODIUM 200 MG/2ML IV SOLN
INTRAVENOUS | Status: DC | PRN
  Administered 2019-11-30: 160 mg via INTRAVENOUS

## 2019-11-30 MED ORDER — 0.9 % SODIUM CHLORIDE (POUR BTL) OPTIME
TOPICAL | Status: DC | PRN
  Administered 2019-11-30 (×4): 1000 mL

## 2019-11-30 MED ORDER — LIDOCAINE 2% (20 MG/ML) 5 ML SYRINGE
INTRAMUSCULAR | Status: AC
  Filled 2019-11-30: qty 5

## 2019-11-30 MED ORDER — HYDROMORPHONE HCL 1 MG/ML IJ SOLN
1.0000 mg | INTRAMUSCULAR | Status: DC | PRN
  Administered 2019-11-30 (×2): 1 mg via INTRAVENOUS
  Filled 2019-11-30 (×2): qty 1

## 2019-11-30 MED ORDER — SODIUM CHLORIDE 0.9 % IV SOLN
INTRAVENOUS | Status: DC | PRN
  Administered 2019-11-30: 500 mL

## 2019-11-30 MED ORDER — CHLORHEXIDINE GLUCONATE 4 % EX LIQD: 60.0000 mL | Freq: Once | CUTANEOUS | Status: DC

## 2019-11-30 MED ORDER — DIAZEPAM 2 MG PO TABS: 2.0000 mg | ORAL_TABLET | Freq: Two times a day (BID) | ORAL | Status: DC | PRN

## 2019-11-30 MED ORDER — DIPHENHYDRAMINE HCL 50 MG/ML IJ SOLN
INTRAMUSCULAR | Status: DC | PRN
  Administered 2019-11-30: 12.5 mg via INTRAVENOUS

## 2019-11-30 MED ORDER — FENTANYL CITRATE (PF) 100 MCG/2ML IJ SOLN
25.0000 ug | INTRAMUSCULAR | Status: DC | PRN
  Administered 2019-11-30 (×2): 50 ug via INTRAVENOUS

## 2019-11-30 MED ORDER — DIPHENHYDRAMINE HCL 50 MG/ML IJ SOLN: 12.5000 mg | Freq: Four times a day (QID) | INTRAMUSCULAR | Status: DC | PRN

## 2019-11-30 MED ORDER — STERILE WATER FOR IRRIGATION IR SOLN
Status: DC | PRN
  Administered 2019-11-30: 1000 mL

## 2019-11-30 MED ORDER — SODIUM CHLORIDE 0.9 % IV SOLN
INTRAVENOUS | Status: AC
  Filled 2019-11-30 (×2): qty 500000

## 2019-11-30 MED ORDER — FENTANYL CITRATE (PF) 100 MCG/2ML IJ SOLN
INTRAMUSCULAR | Status: AC
  Filled 2019-11-30: qty 2

## 2019-11-30 MED ORDER — CEFAZOLIN SODIUM-DEXTROSE 2-4 GM/100ML-% IV SOLN
2.0000 g | INTRAVENOUS | Status: AC
  Administered 2019-11-30: 2 g via INTRAVENOUS
  Filled 2019-11-30: qty 100

## 2019-11-30 MED ORDER — DEXAMETHASONE SODIUM PHOSPHATE 10 MG/ML IJ SOLN
INTRAMUSCULAR | Status: DC | PRN
  Administered 2019-11-30 (×2): 5 mg

## 2019-11-30 MED ORDER — ACETAMINOPHEN 325 MG PO TABS
325.0000 mg | ORAL_TABLET | Freq: Four times a day (QID) | ORAL | Status: DC
  Administered 2019-11-30 – 2019-12-01 (×4): 325 mg via ORAL
  Filled 2019-11-30 (×4): qty 1

## 2019-11-30 MED ORDER — LIDOCAINE 2% (20 MG/ML) 5 ML SYRINGE
INTRAMUSCULAR | Status: DC | PRN
  Administered 2019-11-30: 20 mg via INTRAVENOUS

## 2019-11-30 MED ORDER — FENTANYL CITRATE (PF) 100 MCG/2ML IJ SOLN
INTRAMUSCULAR | Status: DC | PRN
  Administered 2019-11-30 (×3): 50 ug via INTRAVENOUS

## 2019-11-30 MED ORDER — CEFAZOLIN SODIUM-DEXTROSE 2-4 GM/100ML-% IV SOLN
2.0000 g | Freq: Three times a day (TID) | INTRAVENOUS | Status: DC
  Administered 2019-11-30 – 2019-12-01 (×3): 2 g via INTRAVENOUS
  Filled 2019-11-30 (×3): qty 100

## 2019-11-30 MED ORDER — PROPOFOL 10 MG/ML IV BOLUS
INTRAVENOUS | Status: AC
  Filled 2019-11-30: qty 20

## 2019-11-30 MED ORDER — DEXAMETHASONE SODIUM PHOSPHATE 10 MG/ML IJ SOLN
INTRAMUSCULAR | Status: DC | PRN
  Administered 2019-11-30: 5 mg via INTRAVENOUS

## 2019-11-30 MED ORDER — PROPOFOL 10 MG/ML IV BOLUS
INTRAVENOUS | Status: DC | PRN
  Administered 2019-11-30: 150 mg via INTRAVENOUS

## 2019-11-30 MED ORDER — ONDANSETRON HCL 4 MG/2ML IJ SOLN
INTRAMUSCULAR | Status: AC
  Filled 2019-11-30: qty 2

## 2019-11-30 MED ORDER — ONDANSETRON HCL 4 MG/2ML IJ SOLN
4.0000 mg | Freq: Four times a day (QID) | INTRAMUSCULAR | Status: DC | PRN
  Administered 2019-11-30: 4 mg via INTRAVENOUS
  Filled 2019-11-30: qty 2

## 2019-11-30 MED ORDER — METHYLENE BLUE 0.5 % INJ SOLN
INTRAVENOUS | Status: AC
  Filled 2019-11-30: qty 10

## 2019-11-30 MED ORDER — LACTATED RINGERS IV SOLN: INTRAVENOUS | Status: DC | PRN

## 2019-11-30 MED ORDER — ROCURONIUM BROMIDE 10 MG/ML (PF) SYRINGE
PREFILLED_SYRINGE | INTRAVENOUS | Status: AC
  Filled 2019-11-30: qty 10

## 2019-11-30 MED ORDER — CHLORHEXIDINE GLUCONATE 0.12 % MT SOLN
15.0000 mL | Freq: Once | OROMUCOSAL | Status: AC
  Administered 2019-11-30: 15 mL via OROMUCOSAL
  Filled 2019-11-30: qty 15

## 2019-11-30 MED ORDER — ONDANSETRON HCL 4 MG/2ML IJ SOLN
INTRAMUSCULAR | Status: DC | PRN
  Administered 2019-11-30: 4 mg via INTRAVENOUS

## 2019-11-30 MED ORDER — DIPHENHYDRAMINE HCL 12.5 MG/5ML PO ELIX: 12.5000 mg | ORAL_SOLUTION | Freq: Four times a day (QID) | ORAL | Status: DC | PRN

## 2019-11-30 MED ORDER — DEXAMETHASONE SODIUM PHOSPHATE 10 MG/ML IJ SOLN
INTRAMUSCULAR | Status: AC
  Filled 2019-11-30: qty 1

## 2019-11-30 MED ORDER — IBUPROFEN 400 MG PO TABS
400.0000 mg | ORAL_TABLET | Freq: Four times a day (QID) | ORAL | Status: DC
  Administered 2019-11-30 – 2019-12-01 (×4): 400 mg via ORAL
  Filled 2019-11-30 (×4): qty 1

## 2019-11-30 MED ORDER — KCL IN DEXTROSE-NACL 20-5-0.45 MEQ/L-%-% IV SOLN
INTRAVENOUS | Status: DC
  Filled 2019-11-30 (×2): qty 1000

## 2019-11-30 MED ORDER — HEPARIN SODIUM (PORCINE) 5000 UNIT/ML IJ SOLN
5000.0000 [IU] | Freq: Once | INTRAMUSCULAR | Status: AC
  Administered 2019-11-30: 5000 [IU] via SUBCUTANEOUS
  Filled 2019-11-30: qty 1

## 2019-11-30 MED ORDER — ORAL CARE MOUTH RINSE: 15.0000 mL | Freq: Once | OROMUCOSAL | Status: AC

## 2019-11-30 MED ORDER — DIPHENHYDRAMINE HCL 50 MG/ML IJ SOLN
INTRAMUSCULAR | Status: AC
  Filled 2019-11-30: qty 1

## 2019-11-30 MED ORDER — EPHEDRINE 5 MG/ML INJ
INTRAVENOUS | Status: AC
  Filled 2019-11-30: qty 10

## 2019-11-30 SURGICAL SUPPLY — 79 items
APPLIER CLIP 9.375 MED OPEN (MISCELLANEOUS)
BAG DECANTER FOR FLEXI CONT (MISCELLANEOUS) ×3 IMPLANT
BINDER BREAST LRG (GAUZE/BANDAGES/DRESSINGS) ×1 IMPLANT
BINDER BREAST XLRG (GAUZE/BANDAGES/DRESSINGS) IMPLANT
BIOPATCH RED 1 DISK 7.0 (GAUZE/BANDAGES/DRESSINGS) ×7 IMPLANT
CANISTER SUCT 3000ML PPV (MISCELLANEOUS) ×6 IMPLANT
CHLORAPREP W/TINT 26 (MISCELLANEOUS) ×6 IMPLANT
CLIP APPLIE 9.375 MED OPEN (MISCELLANEOUS) ×2 IMPLANT
CNTNR URN SCR LID CUP LEK RST (MISCELLANEOUS) ×2 IMPLANT
CONT SPEC 4OZ STRL OR WHT (MISCELLANEOUS)
COVER PROBE W GEL 5X96 (DRAPES) ×3 IMPLANT
COVER SURGICAL LIGHT HANDLE (MISCELLANEOUS) ×6 IMPLANT
COVER WAND RF STERILE (DRAPES) ×6 IMPLANT
DERMABOND ADVANCED (GAUZE/BANDAGES/DRESSINGS) ×1
DERMABOND ADVANCED .7 DNX12 (GAUZE/BANDAGES/DRESSINGS) ×4 IMPLANT
DRAIN CHANNEL 19F RND (DRAIN) ×6 IMPLANT
DRAPE CHEST BREAST 15X10 FENES (DRAPES) ×3 IMPLANT
DRAPE HALF SHEET 40X57 (DRAPES) ×9 IMPLANT
DRAPE ORTHO SPLIT 77X108 STRL (DRAPES) ×6
DRAPE SURG 17X23 STRL (DRAPES) ×12 IMPLANT
DRAPE SURG ORHT 6 SPLT 77X108 (DRAPES) ×4 IMPLANT
DRAPE UTILITY 15X26 W/TAPE STR (DRAPES) ×1 IMPLANT
DRAPE WARM FLUID 44X44 (DRAPES) ×3 IMPLANT
DRSG PAD ABDOMINAL 8X10 ST (GAUZE/BANDAGES/DRESSINGS) ×12 IMPLANT
DRSG TEGADERM 4X4.5 CHG (GAUZE/BANDAGES/DRESSINGS) ×1 IMPLANT
DRSG TEGADERM 4X4.75 (GAUZE/BANDAGES/DRESSINGS) ×3 IMPLANT
ELECT BLADE 4.0 EZ CLEAN MEGAD (MISCELLANEOUS)
ELECT REM PT RETURN 9FT ADLT (ELECTROSURGICAL) ×9
ELECTRODE BLDE 4.0 EZ CLN MEGD (MISCELLANEOUS) IMPLANT
ELECTRODE REM PT RTRN 9FT ADLT (ELECTROSURGICAL) ×6 IMPLANT
EVACUATOR SILICONE 100CC (DRAIN) ×6 IMPLANT
GAUZE SPONGE 4X4 12PLY STRL (GAUZE/BANDAGES/DRESSINGS) ×6 IMPLANT
GLOVE BIO SURGEON STRL SZ 6.5 (GLOVE) ×6 IMPLANT
GLOVE SURG SYN 7.5  E (GLOVE) ×6
GLOVE SURG SYN 7.5 E (GLOVE) ×4 IMPLANT
GLOVE SURG SYN 7.5 PF PI (GLOVE) ×2 IMPLANT
GOWN STRL REUS W/ TWL LRG LVL3 (GOWN DISPOSABLE) ×6 IMPLANT
GOWN STRL REUS W/ TWL XL LVL3 (GOWN DISPOSABLE) ×2 IMPLANT
GOWN STRL REUS W/TWL LRG LVL3 (GOWN DISPOSABLE) ×9
GOWN STRL REUS W/TWL XL LVL3 (GOWN DISPOSABLE) ×3
GRAFT FLEX HD 6X16 PLIABLE (Tissue) ×2 IMPLANT
ILLUMINATOR WAVEGUIDE N/F (MISCELLANEOUS) IMPLANT
IMPL BREAST 300CC (Breast) IMPLANT
IMPLANT BREAST 300CC (Breast) ×6 IMPLANT
KIT BASIN OR (CUSTOM PROCEDURE TRAY) ×6 IMPLANT
KIT TURNOVER KIT B (KITS) ×6 IMPLANT
LIGHT WAVEGUIDE WIDE FLAT (MISCELLANEOUS) IMPLANT
MARKER SKIN DUAL TIP RULER LAB (MISCELLANEOUS) ×3 IMPLANT
NDL 18GX1X1/2 (RX/OR ONLY) (NEEDLE) IMPLANT
NDL 21 GA WING INFUSION (NEEDLE) IMPLANT
NDL FILTER BLUNT 18X1 1/2 (NEEDLE) IMPLANT
NDL HYPO 25GX1X1/2 BEV (NEEDLE) IMPLANT
NEEDLE 18GX1X1/2 (RX/OR ONLY) (NEEDLE) IMPLANT
NEEDLE 21 GA WING INFUSION (NEEDLE) ×3 IMPLANT
NEEDLE FILTER BLUNT 18X 1/2SAF (NEEDLE)
NEEDLE FILTER BLUNT 18X1 1/2 (NEEDLE) IMPLANT
NEEDLE HYPO 25GX1X1/2 BEV (NEEDLE) IMPLANT
NS IRRIG 1000ML POUR BTL (IV SOLUTION) ×9 IMPLANT
PACK GENERAL/GYN (CUSTOM PROCEDURE TRAY) ×5 IMPLANT
PAD ARMBOARD 7.5X6 YLW CONV (MISCELLANEOUS) ×6 IMPLANT
PENCIL SMOKE EVACUATOR (MISCELLANEOUS) ×4 IMPLANT
PIN SAFETY STERILE (MISCELLANEOUS) ×3 IMPLANT
SET ASEPTIC TRANSFER (MISCELLANEOUS) ×1 IMPLANT
SLEEVE SURGEON STRL (DRAPES) ×1 IMPLANT
SPECIMEN JAR X LARGE (MISCELLANEOUS) ×3 IMPLANT
SUT ETHILON 2 0 FS 18 (SUTURE) ×2 IMPLANT
SUT MNCRL AB 4-0 PS2 18 (SUTURE) ×8 IMPLANT
SUT MON AB 3-0 SH 27 (SUTURE) ×12
SUT MON AB 3-0 SH27 (SUTURE) ×4 IMPLANT
SUT MON AB 5-0 PS2 18 (SUTURE) ×6 IMPLANT
SUT PDS AB 2-0 CT1 27 (SUTURE) ×16 IMPLANT
SUT SILK 2 0 PERMA HAND 18 BK (SUTURE) ×3 IMPLANT
SUT SILK 3 0 PS 1 (SUTURE) ×2 IMPLANT
SUT SILK 4 0 PS 2 (SUTURE) ×2 IMPLANT
SUT VIC AB 3-0 SH 18 (SUTURE) ×4 IMPLANT
SYR CONTROL 10ML LL (SYRINGE) IMPLANT
TOWEL GREEN STERILE (TOWEL DISPOSABLE) ×6 IMPLANT
TOWEL GREEN STERILE FF (TOWEL DISPOSABLE) ×6 IMPLANT
TRAY FOLEY MTR SLVR 14FR STAT (SET/KITS/TRAYS/PACK) IMPLANT

## 2019-11-30 NOTE — Discharge Instructions (Signed)
INSTRUCTIONS FOR AFTER BREAST SURGERY ° ° °You are getting ready to undergo breast surgery.  You will likely have some questions about what to expect following your operation.  The following information will help you and your family understand what to expect when you are discharged from the hospital.  Following these guidelines will help ensure a smooth recovery and reduce risks of complications.   °Postoperative instructions include information on: diet, wound care, medications and physical activity. ° °AFTER SURGERY °Expect to go home after the procedure.  In some cases, you may need to spend one night in the hospital for observation. ° °DIET °Breast surgery does not require a specific diet.  However, the healthier you eat the better your body can start healing. It is important to increasing your protein intake.  This means limiting the foods with sugar and carbohydrates.  Focus on vegetables and some meat.  If you have any liposuction during your procedure be sure to drink water.  If your urine is bright yellow, then it is concentrated, and you need to drink more water.  As a general rule after surgery, you should have 8 ounces of water every hour while awake.  If you find you are persistently nauseated or unable to take in liquids let us know.  NO TOBACCO USE or EXPOSURE.  This will slow your healing process and increase the risk of a wound. ° °WOUND CARE °If you have a drain: You can shower five days after surgery.  Clean with baby wipes until the drain is removed.   ° °If you have steri-strips / tape directly attached to your skin leave them in place. It is OK to get these wet.  No baths, pools or hot tubs for two weeks. °We close your incision to leave the smallest and best-looking scar. No ointment or creams on your incisions until given the go ahead.  Especially not Neosporin (Too many skin reactions with this one).  A few weeks after surgery you can use Mederma and start massaging the scar. °We ask you to  wear your binder or sports bra for the first 6 weeks around the clock, including while sleeping. This provides added comfort and helps reduce the fluid accumulation at the surgery site. ° °ACTIVITY °No heavy lifting until cleared by the doctor.  This usually means no more than a half-gallon of milk.  It is OK to walk and climb stairs. In fact, moving your legs is very important to decrease your risk of a blood clot.  It will also help keep you from getting deconditioned.  Every 1 to 2 hours get up and walk for 5 minutes. This will help with a quicker recovery back to normal.  Let pain be your guide so you don't do too much.  This is not the time for spring cleaning and don't plan on taking care of anyone else.  This time is for you to recover,  °You will be more comfortable if you sleep and rest with your head elevated either with a few pillows under you or in a recliner.  No stomach sleeping for a three months. ° °WORK °Everyone returns to work at different times. As a rough guide, most people take at least 1 - 2 weeks off prior to returning to work. If you need documentation for your job, bring the forms to your postoperative follow up visit. ° °DRIVING °Arrange for someone to bring you home from the hospital.  You may be able to drive a   few days after surgery but not while taking any narcotics or valium. ° °BOWEL MOVEMENTS °Constipation can occur after anesthesia and while taking pain medication.  It is important to stay ahead for your comfort.  We recommend taking Milk of Magnesia (2 tablespoons; twice a day) while taking the pain pills. ° °SEROMA °This is fluid your body tried to put in the surgical site.  This is normal but if it creates tight skinny skin let us know.  It usually decreases in a few weeks. ° °MEDICATIONS and PAIN CONTROL °At your preoperative visit for you history and physical you were given the following medications: °1. An antibiotic: Start this medication when you get home and take according  to the instructions on the bottle. °2. Zofran 4 mg:  This is to treat nausea and vomiting.  You can take this every 6 hours as needed and only if needed. °3. Valium 2 mg: This is for muscle tightness if you have an implant or expander. This will help relax your muscle which also helps with pain control.  This can be taken every 12 hours as needed.  Don't drive after taking this medication. °4. Norco (hydrocodone/acetaminophen) 5/325 mg:  This is only to be used after you have taken the motrin or the tylenol. Every 8 hours as needed. °Over the counter Medication to take: °5. Ibuprofen (Motrin) 600 mg:  Take this every 6 hours.  If you have additional pain then take 500 mg of the tylenol every 8 hours.  Only take the Norco after you have tried these two. °6. Miralax or stool softener of choice: Take this according to the bottle if you take the Norco. ° °WHEN TO CALL °Call your surgeon's office if any of the following occur: °• Fever 101 degrees F or greater °• Excessive bleeding or fluid from the incision site. °• Pain that increases over time without aid from the medications °• Redness, warmth, or pus draining from incision sites °• Persistent nausea or inability to take in liquids °• Severe misshapen area that underwent the operation. ° °Here are some resources: ° °1. Plastic surgery website: https://www.plasticsurgery.org/for-medical-professionals/education-and-resources/publications/breast-reconstruction-magazine °2. Breast Reconstruction Awareness Campaign:  http://www.breastreconusa.org/ °3. Plastic surgery Implant information:  https://www.plasticsurgery.org/patient-safety/breast-implant-safety ° °

## 2019-11-30 NOTE — Anesthesia Procedure Notes (Signed)

## 2019-11-30 NOTE — Interval H&P Note (Signed)
History and Physical Interval Note:  11/30/2019 7:05 AM  Rita Ballard  has presented today for surgery, with the diagnosis of LEFT BREAST CANCER.  The various methods of treatment have been discussed with the patient and family. After consideration of risks, benefits and other options for treatment, the patient has consented to  Procedure(s) with comments: BILATERAL MASTECTOMY WITH LEFT AXILLARY SENTINEL LYMPH NODE BIOPSY (Bilateral) - BIL PEC BLOCK BREAST RECONSTRUCTION WITH PLACEMENT OF TISSUE EXPANDER AND FLEX HD (ACELLULAR HYDRATED DERMIS) (Bilateral) as a surgical intervention.  The patient's history has been reviewed, patient examined, no change in status, stable for surgery.  I have reviewed the patient's chart and labs.  Questions were answered to the patient's satisfaction.     Loel Lofty Braden Cimo

## 2019-11-30 NOTE — Anesthesia Postprocedure Evaluation (Signed)
Anesthesia Post Note  Patient: Rita Ballard  Procedure(s) Performed: BILATERAL MASTECTOMY WITH LEFT AXILLARY SENTINEL LYMPH NODE BIOPSY (Bilateral Breast) BREAST RECONSTRUCTION WITH PLACEMENT OF TISSUE EXPANDER AND FLEX HD (ACELLULAR HYDRATED DERMIS) (Bilateral )     Patient location during evaluation: PACU Anesthesia Type: Regional and General Level of consciousness: awake and alert Pain management: pain level controlled Vital Signs Assessment: post-procedure vital signs reviewed and stable Respiratory status: spontaneous breathing, nonlabored ventilation, respiratory function stable and patient connected to nasal cannula oxygen Cardiovascular status: blood pressure returned to baseline and stable Postop Assessment: no apparent nausea or vomiting Anesthetic complications: no    Last Vitals:  Vitals:   11/30/19 1230 11/30/19 1305  BP: 121/63 120/68  Pulse: (!) 56 68  Resp: 13 14  Temp:  36.7 C  SpO2: 100% 100%    Last Pain:  Vitals:   11/30/19 1305  TempSrc: Oral  PainSc:                  Fallou Hulbert L Adalyn Pennock

## 2019-11-30 NOTE — Interval H&P Note (Signed)
History and Physical Interval Note:  11/30/2019 6:55 AM  Rita Ballard  has presented today for surgery, with the diagnosis of LEFT BREAST CANCER.  The various methods of treatment have been discussed with the patient and family.   She is ready for surgery.  After consideration of risks, benefits and other options for treatment, the patient has consented to  Procedure(s) with comments: BILATERAL MASTECTOMY WITH LEFT AXILLARY SENTINEL LYMPH NODE BIOPSY (Bilateral) - BIL PEC BLOCK BREAST RECONSTRUCTION WITH PLACEMENT OF TISSUE EXPANDER AND FLEX HD (ACELLULAR HYDRATED DERMIS) (Bilateral) as a surgical intervention.  The patient's history has been reviewed, patient examined, no change in status, stable for surgery.  I have reviewed the patient's chart and labs.  Questions were answered to the patient's satisfaction.     Shann Medal

## 2019-11-30 NOTE — Transfer of Care (Signed)
Immediate Anesthesia Transfer of Care Note  Patient: Rita A Foisy  Procedure(s) Performed: BILATERAL MASTECTOMY WITH LEFT AXILLARY SENTINEL LYMPH NODE BIOPSY (Bilateral Breast) BREAST RECONSTRUCTION WITH PLACEMENT OF TISSUE EXPANDER AND FLEX HD (ACELLULAR HYDRATED DERMIS) (Bilateral )  Patient Location: PACU  Anesthesia Type:General  Level of Consciousness: drowsy and patient cooperative  Airway & Oxygen Therapy: Patient Spontanous Breathing  Post-op Assessment: Report given to RN  Post vital signs: Reviewed and stable  Last Vitals:  Vitals Value Taken Time  BP 124/66 11/30/19 1119  Temp    Pulse 79 11/30/19 1120  Resp 29 11/30/19 1120  SpO2 100 % 11/30/19 1120  Vitals shown include unvalidated device data.  Last Pain:  Vitals:   11/30/19 0620  PainSc: 0-No pain      Patients Stated Pain Goal: 5 ([id number])  Complications: No apparent anesthesia complications

## 2019-11-30 NOTE — Anesthesia Procedure Notes (Signed)
Anesthesia Regional Block: Pectoralis block   Pre-Anesthetic Checklist: ,, timeout performed, Correct Patient, Correct Site, Correct Laterality, Correct Procedure, Correct Position, site marked, Risks and benefits discussed,  Surgical consent,  Pre-op evaluation,  At surgeon's request and post-op pain management  Laterality: Right  Prep: Maximum Sterile Barrier Precautions used, chloraprep       Needles:  Injection technique: Single-shot  Needle Type: Echogenic Stimulator Needle     Needle Length: 9cm  Needle Gauge: 21     Additional Needles:   Procedures:,,,, ultrasound used (permanent image in chart),,,,  Narrative:  Start time: 11/30/2019 7:23 AM End time: 11/30/2019 7:30 AM Injection made incrementally with aspirations every 5 mL.  Performed by: Personally  Anesthesiologist: Freddrick March, MD  Additional Notes: Monitors applied. No increased pain on injection. No increased resistance to injection. Injection made in 5cc increments. Good needle visualization. Patient tolerated procedure well.

## 2019-11-30 NOTE — Op Note (Signed)
Op report    DATE OF OPERATION:  11/30/2019  LOCATION: [facility]  SURGICAL DIVISION: Plastic Surgery  PREOPERATIVE DIAGNOSES:  1. Left Breast cancer.    POSTOPERATIVE DIAGNOSES:  1. Left Breast cancer.   PROCEDURE:  1. Bilateral immediate breast reconstruction with placement of Acellular Dermal Matrix and tissue expanders.  SURGEON: Teneka Malmberg Sanger Parys Elenbaas, DO  ASSISTANT: Roetta Sessions, PA  ANESTHESIA:  General.   COMPLICATIONS: None.   IMPLANTS: Left - Mentor 300 cc. Ref #[id number].  Serial Number [id number], 150 cc of injectable saline placed in the expander. Right - Mentor 300 cc. Ref #[id number].  Serial Number [id number], 150 cc of injectable saline placed in the expander. Acellular Dermal Matrix:  6 x 16 cm Flex HD  INDICATIONS FOR PROCEDURE:  The patient, Rita Ballard, is a 44 y.o. female born on Nov 27, 1975, is here for  immediate first stage breast reconstruction with placement of bilateral tissue expander and Acellular dermal matrix. MRN: [id number]  CONSENT:  Informed consent was obtained directly from the patient. Risks, benefits and alternatives were fully discussed. Specific risks including but not limited to bleeding, infection, hematoma, seroma, scarring, pain, implant infection, implant extrusion, capsular contracture, asymmetry, wound healing problems, and need for further surgery were all discussed. The patient did have an ample opportunity to have her questions answered to her satisfaction.   DESCRIPTION OF PROCEDURE:  The patient was taken to the operating room by the general surgery team. SCDs were placed and IV antibiotics were given. The patient's chest was prepped and draped in a sterile fashion. A time out was performed and the implants to be used were identified.  Bilateral mastectomies were performed.  Once the general surgery team had completed their portion of the case the patient was rendered to the plastic and  reconstructive surgery team.  Right:  The pectoralis major muscle was lifted from the chest wall with release of the lateral edge and lateral inframammary fold.  The pocket was irrigated with antibiotic solution and hemostasis was achieved with electrocautery.  The ADM was then prepared according to the manufacture guidelines and slits placed to help with postoperative fluid management.  The ADM was then sutured to the inferior and lateral edge of the inframammary fold with 2-0 PDS starting with an interrupted stitch and then a running stitch.  The lateral portion was sutured to with interrupted sutures after the expander was placed.  The expander was prepared according to the manufacture guidelines, the air evacuated and then it was placed under the ADM and pectoralis major muscle.  The inferior and lateral tabs were used to secure the expander to the chest wall with 2-0 PDS.  The drain was placed at the inframammary fold over the ADM and secured to the skin with 3-0 Silk.  The deep layers were closed with 3-0 Monocryl followed by 4-0 Monocryl.  The skin was closed with 5-0 Monocryl and then dermabond was applied.    Left:   The pectoralis major muscle was lifted from the chest wall with release of the lateral edge and lateral inframammary fold.  The pocket was irrigated with antibiotic solution and hemostasis was achieved with electrocautery.  The ADM was then prepared according to the manufacture guidelines and slits placed to help with postoperative fluid management.  The ADM was then sutured to the inferior and lateral edge of the inframammary fold with 2-0 PDS starting with an interrupted stitch and then a running stitch.  The lateral portion was sutured to  with interrupted sutures after the expander was placed.  The expander was prepared according to the manufacture guidelines, the air evacuated and then it was placed under the ADM and pectoralis major muscle.  The inferior and lateral tabs were used to  secure the expander to the chest wall with 2-0 PDS.  The drain was placed at the inframammary fold over the ADM and secured to the skin with 3-0 Silk.    The deep layers were closed with 3-0 Monocryl followed by 4-0 Monocryl.  The skin was closed with 5-0 Monocryl and then dermabond was applied.  The ABDs and breast binder were placed.  The patient tolerated the procedure well and there were no complications.  The patient was allowed to wake from anesthesia and taken to the recovery room in satisfactory condition.   The advanced practice practitioner (APP) assisted throughout the case.  The APP was essential in retraction and counter traction when needed to make the case progress smoothly.  This retraction and assistance made it possible to see the tissue plans for the procedure.  The assistance was needed for blood control, tissue re-approximation and assisted with closure of the incision site.

## 2019-11-30 NOTE — Anesthesia Procedure Notes (Signed)
Anesthesia Regional Block: Pectoralis block   Pre-Anesthetic Checklist: ,, timeout performed, Correct Patient, Correct Site, Correct Laterality, Correct Procedure, Correct Position, site marked, Risks and benefits discussed,  Surgical consent,  Pre-op evaluation,  At surgeon's request and post-op pain management  Laterality: Left  Prep: Maximum Sterile Barrier Precautions used, chloraprep       Needles:  Injection technique: Single-shot  Needle Type: Echogenic Stimulator Needle     Needle Length: 9cm  Needle Gauge: 21     Additional Needles:   Procedures:,,,, ultrasound used (permanent image in chart),,,,  Narrative:  Start time: 11/30/2019 7:12 AM End time: 11/30/2019 7:22 AM Injection made incrementally with aspirations every 5 mL.  Performed by: Personally  Anesthesiologist: Freddrick March, MD  Additional Notes: Monitors applied. No increased pain on injection. No increased resistance to injection. Injection made in 5cc increments. Good needle visualization. Patient tolerated procedure well.

## 2019-11-30 NOTE — Op Note (Signed)
11/30/2019  9:36 AM  PATIENT:  Rita Ballard, 44 y.o., female, MRN: [id number]  PREOP DIAGNOSIS:  LEFT BREAST CANCER  POSTOP DIAGNOSIS:   Left breast cancer, 2 o'clock position (Tis, N0)  PROCEDURE:   Procedure(s):  BILATERAL MASTECTOMY WITH LEFT AXILLARY SENTINEL LYMPH NODE BIOPSY, deep sentinel lymph node biopsy - Allyn Bartelson BREAST RECONSTRUCTION WITH PLACEMENT OF TISSUE EXPANDER AND FLEX HD (ACELLULAR HYDRATED DERMIS) - [facility]  SURGEON:   Alphonsa Overall, M.D.  ASSISTANT:   None  ANESTHESIA:  General  Anesthesiologist: Freddrick March, MD CRNA: Barrington Ellison, CRNA; Colin Benton, CRNA  General  ASA:  2  EBL:  150  ml  BLOOD ADMINISTERED: none  DRAINS: per Dr. Marla Roe  LOCAL MEDICATIONS USED:   bilateral pectoral block  SPECIMEN:   Right breast (suture lateral), left breast (suture lateral), left axillary sentinel lymph node biopsy (counts 180, background 5)  COUNTS CORRECT:  YES  INDICATIONS FOR PROCEDURE:  Rita Ballard is a 44 y.o. (DOB: 03-14-1976) white female whose primary care physician is Circle, [place] and comes for bilateral mastectomy.  She has widespread DCIS of the left breast.  She wants a prophylactic right mastectomy.  She has seen Dr. Lindi Adie for oncology.   The indications and risks of the surgery were explained to the patient.  The risks include, but are not limited to, infection, bleeding, and nerve injury.   In the holding area, her left areola was injected with 1 millicurie of Technitium Sulfur Colloid.  OPERATIVE NOTE;  The patient was taken to room # 8 at [facility] where she underwent a general anesthesia  supervised by Anesthesiologist: Freddrick March, MD CRNA: Barrington Ellison, CRNA; Colin Benton, CRNA. Both her breast and axilla were prepped with ChloraPrep and sterilely draped.    A time-out and the surgical check list was reviewed.    I started the benign side, the right breast.  Dr. Marla Roe  had marked the patient for the incisions.  I made an elliptical incision including the areola in the right breast.  I developed skin flaps medially to the lateral Ballard of the sternum, inferiorly to the investing fascia of the rectus abdominus muscle, laterally to the anterior Ballard of the latissimus dorsi muscle, and superiorly to about 2 finger breaths below the clavicle.  The breast was reflected off the pectoralis muscle from medial to lateral.  The lateral attachments in the right axilla were divided and the breast removed.  A long suture was placed on the lateral aspect of the breast.   I then addressed the left breast.  I made an elliptical incision including the areola in the left breast.  I developed skin flaps medially to the lateral Ballard of the sternum, inferiorly to the investing fascia of the rectus abdominus muscle, laterally to the anterior Ballard of the latissimus dorsi muscle, and superiorly to about 2 finger breaths below the clavicle.  The breast was reflected off the pectoralis muscle from medial to lateral.  The lateral attachments in the left  axilla were divided and the breast removed.  A long suture was placed on the lateral aspect of the breast.   I dissected into the left  axilla and found a deep sentinel lymph node.  The node had counts of 180 with a background count of 5.  This was sent as a separate specimen.   Dr. Marla Roe now scrubbed in to do the breast reconstruction.  She will dictate  this portion of the operation.  Alphonsa Overall, MD, [facility] Pager: [phone] Office phone:  [phone]

## 2019-12-01 DIAGNOSIS — C50412 Malignant neoplasm of upper-outer quadrant of left female breast: Secondary | ICD-10-CM | POA: Diagnosis not present

## 2019-12-01 LAB — HIV ANTIBODY (ROUTINE TESTING W REFLEX): HIV Screen 4th Generation wRfx: NONREACTIVE

## 2019-12-01 NOTE — Discharge Summary (Signed)
Physician Discharge Summary  Patient ID: Rita Ballard MRN: [id number] DOB/AGE: 1975/10/15 44 y.o.  Admit date: 11/30/2019 Discharge date: 12/01/2019  Admission Diagnoses: Breast cancer  Discharge Diagnoses:  Active Problems:   Breast cancer [facility])   Discharged Condition: good  Hospital Course: Patient is a 44 year old female who underwent bilateral mastectomy with left axillary sentinel lymph node biopsy yesterday with Dr. Lucia Gaskins and breast reconstruction with placement of tissue expander and Flex HD with Dr. Marla Roe.  She had no overnight events.  Reports other than pain she is feeling well.  Pain is well controlled on ibuprofen.  Reports her last dose of IV pain medicine was yesterday around 6:00.  Her incisions are C/D/I.  No signs of infection, seroma/hematoma.  Very small amount of drainage on gauze.  She denies fever, chest pain, shortness of breath, nausea/vomiting.  Consults: None  Significant Diagnostic Studies: Surgical tissue sent to pathology  Treatments: surgery: bilateral mastectomies with left sentinel lymph node biopsy and placement of tissue expander and flex HD  Discharge Exam: Blood pressure (!) 116/47, pulse 66, temperature 98.1 F (36.7 C), temperature source Oral, resp. rate 17, height 5\' 8"  (1.727 m), weight 80 kg, last menstrual period 11/17/2019, SpO2 98 %, unknown if currently breastfeeding. General appearance: alert, cooperative and no distress Head: Normocephalic, without obvious abnormality, atraumatic Eyes: EOMs intact Resp: nonlabored Breasts: Bilateral incisions C/D/I.  No signs of infection, seroma/hematoma.  Bilateral drains in place.  Small amount of dried drainage on gauze.  Breast binder in place.  Disposition: Discharge disposition: 01-Home or Self Care       Discharge Instructions    Call MD for:  difficulty breathing, headache or visual disturbances   Complete by: As directed    Call MD for:  extreme fatigue   Complete by: As  directed    Call MD for:  hives   Complete by: As directed    Call MD for:  persistant dizziness or light-headedness   Complete by: As directed    Call MD for:  persistant nausea and vomiting   Complete by: As directed    Call MD for:  redness, tenderness, or signs of infection (pain, swelling, redness, odor or green/yellow discharge around incision site)   Complete by: As directed    Call MD for:  severe uncontrolled pain   Complete by: As directed    Call MD for:  temperature >100.4   Complete by: As directed    Diet - low sodium heart healthy   Complete by: As directed    Increase activity slowly   Complete by: As directed      Allergies as of 12/01/2019   No Known Allergies     Medication List    TAKE these medications   diazepam 2 MG tablet Commonly known as: Valium Take 1 tablet (2 mg total) by mouth every 12 (twelve) hours as needed for anxiety.   Fish Oil 1000 MG Cpdr Take 1 capsule by mouth daily.   multivitamin capsule Take 1 capsule by mouth daily.   ondansetron 4 MG tablet Commonly known as: Zofran Take 1 tablet (4 mg total) by mouth every 8 (eight) hours as needed for nausea or vomiting.      Follow-up Information    Alphonsa Overall, MD In 2 weeks.   Specialty: General Surgery Contact information: [address] [place] [address] [phone]        Wallace Going, DO In 1 week.   Specialty: Plastic  Surgery Contact information: [place] [address] [phone]            Signed: Threasa Heads 12/01/2019, 7:26 AM

## 2019-12-01 NOTE — Plan of Care (Signed)
  Problem: Activity: Goal: Risk for activity intolerance will decrease Outcome: Progressing   Problem: Coping: Goal: Level of anxiety will decrease Outcome: Progressing   Problem: Elimination: Goal: Will not experience complications related to bowel motility Outcome: Progressing Goal: Will not experience complications related to urinary retention Outcome: Progressing   Problem: Pain Managment: Goal: General experience of comfort will improve Outcome: Progressing   Problem: Safety: Goal: Ability to remain free from injury will improve Outcome: Progressing   

## 2019-12-05 LAB — SURGICAL PATHOLOGY

## 2019-12-07 ENCOUNTER — Other Ambulatory Visit: Payer: Self-pay | Admitting: Surgical

## 2019-12-07 ENCOUNTER — Ambulatory Visit (INDEPENDENT_AMBULATORY_CARE_PROVIDER_SITE_OTHER): Payer: 59 | Admitting: Plastic Surgery

## 2019-12-07 ENCOUNTER — Encounter: Payer: Self-pay | Admitting: *Deleted

## 2019-12-07 ENCOUNTER — Telehealth: Payer: Self-pay | Admitting: Plastic Surgery

## 2019-12-07 ENCOUNTER — Encounter: Payer: Self-pay | Admitting: Plastic Surgery

## 2019-12-07 ENCOUNTER — Other Ambulatory Visit: Payer: Self-pay

## 2019-12-07 VITALS — BP 121/86 | HR 60 | Temp 98.3°F

## 2019-12-07 DIAGNOSIS — C50412 Malignant neoplasm of upper-outer quadrant of left female breast: Secondary | ICD-10-CM

## 2019-12-07 DIAGNOSIS — Z171 Estrogen receptor negative status [ER-]: Secondary | ICD-10-CM

## 2019-12-07 MED ORDER — DIAZEPAM 2 MG PO TABS: 2.0000 mg | ORAL_TABLET | Freq: Two times a day (BID) | ORAL | 0 refills | Status: AC | PRN

## 2019-12-07 NOTE — Progress Notes (Signed)
   Subjective:    Patient ID: Rita Ballard, female    DOB: 25-May-1976, 44 y.o.   MRN: [id number]  The patient is a 44 year old female here for follow-up after undergoing bilateral mastectomies with expander placement for immediate reconstruction.  Her drains are in base and draining they are not ready for removal yet.  Incisions are healing nicely.  No sign of infection.  None of seroma or hematoma.  She has not gotten the Valium and is having a little bit of trouble sleeping.  She feels uptight due to the expanders.    Review of Systems  Constitutional: Positive for activity change. Negative for appetite change.  Eyes: Negative.   Respiratory: Negative.   Cardiovascular: Negative.   Genitourinary: Negative.   Musculoskeletal: Negative.   Hematological: Negative.        Objective:   Physical Exam Vitals and nursing note reviewed.  Constitutional:      Appearance: Normal appearance.  HENT:     Head: Normocephalic and atraumatic.  Cardiovascular:     Rate and Rhythm: Normal rate.     Pulses: Normal pulses.  Pulmonary:     Effort: Pulmonary effort is normal.  Neurological:     General: No focal deficit present.     Mental Status: She is alert and oriented to person, place, and time.  Psychiatric:        Mood and Affect: Mood normal.        Behavior: Behavior normal.         Assessment & Plan:     ICD-10-CM   1. Malignant neoplasm of upper-outer quadrant of left breast in female, estrogen receptor negative ([place])  C50.412    Z17.1     I encouraged her to pick up the Valium which I reordered.  This could be something she could take at night it will help with the pressure she feels on her chest.  We will plan on removing the drains next week.  We placed injectable saline in the Expander using a sterile technique: Right: 50 cc for a total of 200/300 cc Left: 50 cc for a total of 200/300 cc

## 2019-12-07 NOTE — Telephone Encounter (Signed)
Spoke with patient, Valium sent in during preop appointment was accidentally prescribed for "anxiety", was meant to be prescribed for "muscle spasms".  I explained to the patient that these are the same medication but they can also be used for muscle spasms or anxiety.  She understood and wanted to just confirm that they were the same medication.  She is doing well otherwise.  She is going to take 1 tonight prior to bed.

## 2019-12-07 NOTE — Telephone Encounter (Signed)
Patient called because Dr. Marla Roe said she would call in a muscle relaxer and the pharmacy told her the medication that was called in was for anxiety. She said she picked up medication for anxiety last week so she still has some. She would like to confirm the name of the medicine Dr. Marla Roe would be calling in to make sure it's not what she already has and to make sure the right one is being called in. Please call her to advise.

## 2019-12-11 NOTE — Progress Notes (Signed)
HEMATOLOGY-ONCOLOGY MYCHART VIDEO VISIT PROGRESS NOTE  I connected with Rita Ballard on 12/12/2019 at 11:30 AM EDT by MyChart video conference and verified that I am speaking with the correct person using two identifiers.  I discussed the limitations, risks, security and privacy concerns of performing an evaluation and management service by MyChart and the availability of in person appointments.  I also discussed with the patient that there may be a patient responsible charge related to this service. The patient expressed understanding and agreed to proceed.  Patient's Location: Home Physician Location: Clinic  CHIEF COMPLIANT: Follow-up s/p bilateral mastectomies to review pathology   INTERVAL HISTORY: Rita Ballard is a 44 y.o. female with above-mentioned history of left breast DCIS. She underwent bilateral mastectomies with reconstruction on 11/30/19 for which pathology showed in the right breast, PASH, intraductal papilloma, and ductal hyperplasia, and in the left breast, high grade DCIS with necrosis, 4.5cm, 2 left axillary lymph nodes negative for carcinoma. She presents over MyChart today to review the pathology report and discuss further treatment.   Oncology History  Malignant neoplasm of upper-outer quadrant of left breast in female, estrogen receptor negative ([place])  10/13/2019 Cancer Staging   Staging form: Breast, AJCC 8th Edition - Clinical stage from 10/13/2019: Stage 0 (cTis (DCIS), cN0, cM0, ER-, PR-) - Signed by Gardenia Phlegm, NP on 10/25/2019   10/13/2019 Initial Diagnosis   Screening detected left breast calcifications with high breast density, UOQ: 9 x 8 x 5 cm.  Anterior and posterior end of the calcifications were biopsied revealing high-grade DCIS with calcifications ER 0%, PR 0%    Genetic Testing   Negative genetic testing. No pathogenic variants identified on the [facility] Common Hereditary Cancers Panel. VUS in ATM called c.8428A>C identified. The report  date is 11/03/2019.   The Common Hereditary Cancers Panel offered by [facility] includes sequencing and/or deletion duplication testing of the following 48 genes: APC, ATM, AXIN2, BARD1, BMPR1A, BRCA1, BRCA2, BRIP1, CDH1, CDKN2A (p14ARF), CDKN2A (p16INK4a), CKD4, CHEK2, CTNNA1, DICER1, EPCAM (Deletion/duplication testing only), GREM1 (promoter region deletion/duplication testing only), KIT, MEN1, MLH1, MSH2, MSH3, MSH6, MUTYH, NBN, NF1, NHTL1, PALB2, PDGFRA, PMS2, POLD1, POLE, PTEN, RAD50, RAD51C, RAD51D, RNF43, SDHB, SDHC, SDHD, SMAD4, SMARCA4. STK11, TP53, TSC1, TSC2, and VHL.  The following genes were evaluated for sequence changes only: SDHA and HOXB13 c.251G>A variant only.   11/30/2019 Surgery   Bilateral mastectomies ([facility] & Lucia Gaskins):  Right breast: PASH, intraductal papilloma, and ductal hyperplasia Left breast: high grade DCIS with necrosis, 4.5cm, 2 left axillary lymph nodes negative for carcinoma.      Observations/Objective:  There were no vitals filed for this visit. There is no height or weight on file to calculate BMI.  I have reviewed the data as listed No flowsheet data found.  Lab Results  Component Value Date   WBC 6.3 11/27/2019   HGB 13.5 11/27/2019   HCT 42.0 11/27/2019   MCV 91.9 11/27/2019   PLT 296 11/27/2019      Assessment Plan:  Malignant neoplasm of upper-outer quadrant of left breast in female, estrogen receptor negative ([place]) 10/13/2019:Screening detected left breast calcifications with high breast density, UOQ: 9 x 8 x 5 cm.  Anterior and posterior end of the calcifications were biopsied revealing high-grade DCIS with calcifications ER 0%, PR 0% Tis NX stage 0  11/30/19: Bilateral mastectomies ([facility] & Lucia Gaskins):  Right breast: PASH, intraductal papilloma, and ductal hyperplasia Left breast: high grade DCIS with necrosis, 4.5cm, 2 left axillary lymph nodes negative  for carcinoma.   Pathology counseling: I discussed the final pathology report of the  patient provided  a copy of this report. I discussed the margins. We also discussed the final staging along with previously performed ER/PR testing.   There is no role of radiation or antiestrogen therapy. Return to clinic on an as-needed basis   I discussed the assessment and treatment plan with the patient. The patient was provided an opportunity to ask questions and all were answered. The patient agreed with the plan and demonstrated an understanding of the instructions. The patient was advised to call back or seek an in-person evaluation if the symptoms worsen or if the condition fails to improve as anticipated.   I provided 20 minutes of face-to-face MyChart video visit time during this encounter.    Rulon Eisenmenger, MD 12/12/2019   I, Molly Dorshimer, am acting as scribe for Nicholas Lose, MD.  I have reviewed the above documentation for accuracy and completeness, and I agree with the above.

## 2019-12-12 ENCOUNTER — Inpatient Hospital Stay: Payer: 59 | Attending: Hematology and Oncology | Admitting: Hematology and Oncology

## 2019-12-12 DIAGNOSIS — C50412 Malignant neoplasm of upper-outer quadrant of left female breast: Secondary | ICD-10-CM | POA: Diagnosis not present

## 2019-12-12 DIAGNOSIS — Z171 Estrogen receptor negative status [ER-]: Secondary | ICD-10-CM

## 2019-12-12 NOTE — Assessment & Plan Note (Signed)
10/13/2019:Screening detected left breast calcifications with high breast density, UOQ: 9 x 8 x 5 cm.  Anterior and posterior end of the calcifications were biopsied revealing high-grade DCIS with calcifications ER 0%, PR 0% Tis NX stage 0  11/30/19: Bilateral mastectomies ([facility] & Lucia Gaskins):  Right breast: PASH, intraductal papilloma, and ductal hyperplasia Left breast: high grade DCIS with necrosis, 4.5cm, 2 left axillary lymph nodes negative for carcinoma.   Pathology counseling: I discussed the final pathology report of the patient provided  a copy of this report. I discussed the margins. We also discussed the final staging along with previously performed ER/PR testing.   There is no role of radiation or antiestrogen therapy.

## 2019-12-15 ENCOUNTER — Other Ambulatory Visit: Payer: Self-pay

## 2019-12-15 ENCOUNTER — Encounter: Payer: Self-pay | Admitting: Surgical

## 2019-12-15 ENCOUNTER — Encounter: Payer: Self-pay | Admitting: Plastic Surgery

## 2019-12-15 ENCOUNTER — Ambulatory Visit (INDEPENDENT_AMBULATORY_CARE_PROVIDER_SITE_OTHER): Payer: 59 | Admitting: Surgical

## 2019-12-15 VITALS — BP 120/78 | HR 69 | Temp 97.8°F | Wt 175.0 lb

## 2019-12-15 DIAGNOSIS — Z171 Estrogen receptor negative status [ER-]: Secondary | ICD-10-CM

## 2019-12-15 DIAGNOSIS — C50412 Malignant neoplasm of upper-outer quadrant of left female breast: Secondary | ICD-10-CM

## 2019-12-15 DIAGNOSIS — Z1379 Encounter for other screening for genetic and chromosomal anomalies: Secondary | ICD-10-CM

## 2019-12-15 NOTE — Progress Notes (Signed)
   Subjective:     Patient ID: Rita Ballard, female    DOB: 27-Jul-1975, 44 y.o.   MRN: [id number]  Chief Complaint  Patient presents with  . Follow-up    HPI: The patient is a 44 y.o. female here for follow-up on her bilateral breast reconstruction.  She underwent mastectomies with immediate reconstruction on 11/30/2019.  She recently saw oncology, oncology noted no role for antiestrogen or radiation therapy.  She is here with her husband today.  She is overall doing well, JP drains in place with 3 to 5 cc of output over 24 hours for multiple days.  She is not having any fevers or chills.  Incisions are intact.  She reports that she is doing well with pain, reports numbness of bilateral breast.  She would like to know if she can drive.   Review of Systems  Constitutional: Positive for activity change.  Cardiovascular: Negative.   Musculoskeletal: Positive for back pain.  Skin: Negative.      Objective:   Vital Signs BP 120/78 (BP Location: Right Arm, Patient Position: Sitting, Cuff Size: Normal)   Pulse 69   Temp 97.8 F (36.6 C) (Temporal)   Wt 175 lb (79.4 kg)   LMP 11/17/2019 (Exact Date)   SpO2 98%   BMI 26.61 kg/m  Vital Signs and Nursing Note Reviewed Chaperone present Physical Exam  Constitutional: She is oriented to person, place, and time and well-developed, well-nourished, and in no distress.  Pulmonary/Chest: Effort normal.    Bilateral JP drains in place with serosanguineous output involves approximately 3 cc.  No peridrain insertion erythema.  Incisions are C/D/I, no drainage, Dermabond in place.  Neurological: She is alert and oriented to person, place, and time.  Skin: Skin is warm and dry. No rash noted. She is not diaphoretic. No erythema.  Psychiatric: Mood and affect normal.      Assessment/Plan:     ICD-10-CM   1. Malignant neoplasm of upper-outer quadrant of left breast in female, estrogen receptor negative ([place])  C50.412    Z17.1   2.  Genetic testing  Z13.79     [name] is doing well, she tolerated removal of JP drains.  Recommend applying Vaseline and gauze for a few days until insertion sites have closed.  She can shower.  Recommend Valium today after fill to help relax muscle.  No sign of infection.  Incisions are healing nicely.  Removed Monocryl suture knots from bilateral breasts.  They stated that they would like to travel to [place] in the next few weeks, this will be fine as long as she does not stay sedentary for multiple hours.  They agreed and understood.  Follow-up scheduled for 2 weeks, call with any questions or concerns  We placed injectable saline in the Expander using a sterile technique: Right: 50 cc for a total of 250/300 cc Left: 50 cc for a total of 250/300 cc    Charlies Constable, PA-C 12/15/2019, 9:37 AM

## 2019-12-15 NOTE — Addendum Note (Signed)
Addended byRoetta Sessions on: 12/15/2019 03:47 PM   Modules accepted: Orders

## 2019-12-28 NOTE — Progress Notes (Signed)
   Subjective:     Patient ID: Rita Ballard, female    DOB: 10-25-1975, 44 y.o.   MRN: [id number]  Chief Complaint  Patient presents with  . Follow-up    HPI: The patient is a 44 y.o. female here for follow-up on her bilateral breast reconstruction after mastectomies.  She reports that overall she is doing well, but is having a lot of pain in left axillary region and intermittent burning sensation.  She reports she has discussed this with general surgery and a discussed the possibility of some nerve damage and that it will improve over time.  Patient reports that she is scheduled for physical therapy in approximately 10 days.  She has not had any fevers, chills, nausea, vomiting.  Review of Systems  Constitutional: Negative.   Respiratory: Negative.   Cardiovascular: Negative.   Musculoskeletal: Positive for arthralgias and myalgias.  Skin: Negative for color change, rash and wound.     Objective:   Vital Signs BP 122/81 (BP Location: Left Arm, Patient Position: Sitting, Cuff Size: Large)   Pulse 77   Temp 98.7 F (37.1 C) (Temporal)   Ht 5\' 8"  (1.727 m)   Wt 179 lb (81.2 kg)   SpO2 99%   BMI 27.22 kg/m  Vital Signs and Nursing Note Reviewed Chaperone present Physical Exam Constitutional:      General: She is not in acute distress.    Appearance: Normal appearance. She is not ill-appearing.  Cardiovascular:     Rate and Rhythm: Normal rate.  Pulmonary:     Effort: Pulmonary effort is normal.  Chest:    Musculoskeletal:        General: Tenderness (left axilla) present. No swelling. Normal range of motion.  Skin:    General: Skin is warm and dry.  Neurological:     General: No focal deficit present.     Mental Status: She is alert and oriented to person, place, and time. Mental status is at baseline.  Psychiatric:        Mood and Affect: Mood normal.        Behavior: Behavior normal.     Assessment/Plan:     ICD-10-CM   1. Malignant neoplasm of  upper-outer quadrant of left breast in female, estrogen receptor negative ([place])  C50.412    Z17.1    Overall she is doing well, she continues to have some pain in her left axillary region and some tightness after fills.  She is planning to begin physical therapy soon.  [place] [employer] and is required to lift greater than 75 pounds, she is unable to do this at this point due to the ongoing pain and restrictions.  I discussed with patient that she should call her employer and inform them of her restrictions and that I can assist with any documentation needed if required.  We placed injectable saline in the Expander using a sterile technique: Right: 30 cc for a total of 280/300 cc Left: 30 cc for a total of 280/300 cc  Follow up in 2 weeks  Charlies Constable, PA-C 12/29/2019, 9:31 AM

## 2019-12-29 ENCOUNTER — Ambulatory Visit (INDEPENDENT_AMBULATORY_CARE_PROVIDER_SITE_OTHER): Payer: 59 | Admitting: Surgical

## 2019-12-29 ENCOUNTER — Other Ambulatory Visit: Payer: Self-pay

## 2019-12-29 ENCOUNTER — Encounter: Payer: Self-pay | Admitting: Surgical

## 2019-12-29 VITALS — BP 122/81 | HR 77 | Temp 98.7°F | Ht 68.0 in | Wt 179.0 lb

## 2019-12-29 DIAGNOSIS — Z171 Estrogen receptor negative status [ER-]: Secondary | ICD-10-CM

## 2019-12-29 DIAGNOSIS — C50412 Malignant neoplasm of upper-outer quadrant of left female breast: Secondary | ICD-10-CM

## 2020-01-12 ENCOUNTER — Encounter: Payer: Self-pay | Admitting: Plastic Surgery

## 2020-01-12 ENCOUNTER — Ambulatory Visit (INDEPENDENT_AMBULATORY_CARE_PROVIDER_SITE_OTHER): Payer: 59 | Admitting: Plastic Surgery

## 2020-01-12 ENCOUNTER — Other Ambulatory Visit: Payer: Self-pay

## 2020-01-12 VITALS — HR 74 | Temp 97.8°F

## 2020-01-12 DIAGNOSIS — C50412 Malignant neoplasm of upper-outer quadrant of left female breast: Secondary | ICD-10-CM

## 2020-01-12 DIAGNOSIS — Z9013 Acquired absence of bilateral breasts and nipples: Secondary | ICD-10-CM

## 2020-01-12 DIAGNOSIS — Z171 Estrogen receptor negative status [ER-]: Secondary | ICD-10-CM

## 2020-01-12 NOTE — Progress Notes (Signed)
   Subjective:    Patient ID: Rita Ballard, female    DOB: Jul 27, 1975, 44 y.o.   MRN: [id number]  The patient is a 44 year old female here with her husband for follow-up.  She underwent bilateral mastectomies with bilateral reconstruction with expanders and Flex HD placement.  She has started physical therapy.  She knows she has some work to do with improving her range of motion.  The intermittent burning and that seems to be getting better.  She has a little bit lateral movement of the expanders.  The right side is a little worse than the left side.  We will need to try to move them more medially I am of her exchange.  She would like to go ahead and get that scheduled.  She has some birthdays that she will be celebrating and wants to work her surgery around those.     Review of Systems  Constitutional: Negative.   HENT: Negative.   Eyes: Negative.   Respiratory: Negative.   Cardiovascular: Negative.   Gastrointestinal: Negative.   Genitourinary: Negative.   Musculoskeletal: Negative.   Hematological: Negative.   Psychiatric/Behavioral: Negative.        Objective:   Physical Exam Vitals and nursing note reviewed.  Constitutional:      Appearance: Normal appearance.  HENT:     Head: Normocephalic and atraumatic.  Cardiovascular:     Rate and Rhythm: Normal rate.     Pulses: Normal pulses.  Pulmonary:     Effort: Pulmonary effort is normal.  Neurological:     General: No focal deficit present.     Mental Status: She is alert and oriented to person, place, and time.  Psychiatric:        Mood and Affect: Mood normal.        Behavior: Behavior normal.         Assessment & Plan:     ICD-10-CM   1. Malignant neoplasm of upper-outer quadrant of left breast in female, estrogen receptor negative ([place])  C50.412    Z17.1   2. Acquired absence of breast and absent nipple, bilateral  Z90.13     We placed injectable saline in the Expander using a sterile technique: Right: 50  cc for a total of 330 / 300 cc Left: 50 cc for a total of 330 / 300 cc  Patient would like to move ahead with her breast exchange.  She is interested in silicone implants. We also discussed that she may need some fat grafting.  She is also a candidate for nipple areola tattoo for reconstruction. The plan is bilateral removal of the breast expanders and placement of silicone implants. An order was sent to [facility]. Pictures were obtained of the patient and placed in the chart with the patient's or guardian's permission.

## 2020-01-26 ENCOUNTER — Ambulatory Visit (INDEPENDENT_AMBULATORY_CARE_PROVIDER_SITE_OTHER): Payer: 59 | Admitting: Plastic Surgery

## 2020-01-26 ENCOUNTER — Other Ambulatory Visit: Payer: Self-pay

## 2020-01-26 ENCOUNTER — Encounter: Payer: Self-pay | Admitting: Plastic Surgery

## 2020-01-26 VITALS — BP 123/83 | HR 73 | Temp 97.8°F

## 2020-01-26 DIAGNOSIS — Z171 Estrogen receptor negative status [ER-]: Secondary | ICD-10-CM

## 2020-01-26 DIAGNOSIS — C50412 Malignant neoplasm of upper-outer quadrant of left female breast: Secondary | ICD-10-CM

## 2020-01-26 DIAGNOSIS — Z9013 Acquired absence of bilateral breasts and nipples: Secondary | ICD-10-CM

## 2020-01-26 NOTE — Progress Notes (Signed)
   Subjective:    Patient ID: Rita Ballard, female    DOB: Mar 25, 1976, 44 y.o.   MRN: [id number]  Patient is a 44 year old female here with her husband for follow-up on her breast surgery.  She is undergoing expansion of bilateral breasts after mastectomy.  She has been undergoing physical therapy and this seems to be helping with her range of motion and strength.  There is no sign of infection and she is overall pleased with her size.  We will need to medialize the implants at the time of the exchange   Review of Systems  Constitutional: Positive for activity change.  HENT: Negative.   Eyes: Negative.   Respiratory: Negative.   Cardiovascular: Negative.   Endocrine: Negative.   Genitourinary: Negative.   Musculoskeletal: Negative.   Neurological: Negative for facial asymmetry.       Objective:   Physical Exam Vitals and nursing note reviewed.  Constitutional:      Appearance: Normal appearance.  HENT:     Head: Normocephalic and atraumatic.  Cardiovascular:     Rate and Rhythm: Normal rate.     Pulses: Normal pulses.  Pulmonary:     Effort: Pulmonary effort is normal.  Neurological:     General: No focal deficit present.     Mental Status: She is alert. Mental status is at baseline.  Psychiatric:        Mood and Affect: Mood normal.        Behavior: Behavior normal.        Assessment & Plan:     ICD-10-CM   1. Malignant neoplasm of upper-outer quadrant of left breast in female, estrogen receptor negative ([place])  C50.412    Z17.1   2. Acquired absence of breast and absent nipple, bilateral  Z90.13     We placed injectable saline in the Expander using a sterile technique: Right: 30 cc for a total of 360 / 300 cc Left: 30 cc for a total of 360 / 300 cc  This will likely be the last fill and we will plan on exchange in August.

## 2020-02-14 NOTE — Progress Notes (Signed)
ICD-10-CM   1. Acquired absence of breast and absent nipple, bilateral  Z90.13   2. Malignant neoplasm of upper-outer quadrant of left breast in female, estrogen receptor negative [facility])  C50.412    Z17.1       Patient ID: Rita Ballard, female    DOB: 02-12-1976, 44 y.o.   MRN: [id number]   History of Present Illness: Rita Ballard is a 44 y.o.  female  with a history of left breast cancer ER negative.  She presents for preoperative evaluation for upcoming procedure, bilateral removal of tissue expanders and placement of silicone implants, scheduled for 02/29/2020 with Dr. Marla Roe.  Summary from previous visit: Patient has been undergoing expansion of bilateral breast implants after mastectomy.  She has been undergoing physical therapy and it has been helping with her range of motion and strength.  Implants will need to be medialized at time of exchange.  Total saline placed in the expanders: Right: 360 / 300 cc Left:   360 / 300 cc  Job: [employer] check[employer]in  PMH Significant for: Endometriosis, scoliosis (as a child)  The patient has had problems with anesthesia. Reports she had NO problems with anesthesia at her last surgery. She reports she was slow to wake up with general anesthesia with a c-section performed in the [place] several years ago, but no issued with her surgery here in the [place].   Past Medical History: Allergies: No Known Allergies  Current Medications:  Current Outpatient Medications:  .  diazepam (VALIUM) 2 MG tablet, Take 1 tablet (2 mg total) by mouth every 12 (twelve) hours as needed for anxiety., Disp: 20 tablet, Rfl: 0 .  Multiple Vitamin (MULTIVITAMIN) capsule, Take 1 capsule by mouth daily., Disp:  , Rfl:  .  Omega-3 Fatty Acids (FISH OIL) 1000 MG CPDR, Take 1 capsule by mouth daily., Disp: , Rfl:  .  ondansetron (ZOFRAN) 4 MG tablet, Take 1 tablet (4 mg total) by mouth every 8 (eight) hours as needed for nausea or vomiting. (Patient not taking:  Reported on 01/12/2020), Disp: 20 tablet, Rfl: 0  Past Medical Problems: Past Medical History:  Diagnosis Date  . Cancer ([place]) 2021   Ductal left breast -upper left quad  . Complication of anesthesia    slow to wake up with general anesthesia with C/S - no epidural or spinal  . History of heavy vaginal bleeding    Dr Cletis Media - will do hysterectomy after patient recovers from breast ca surgery  . Hx of endometriosis   . Hx of scoliosis    As a child    Past Surgical History: Past Surgical History:  Procedure Laterality Date  . BREAST RECONSTRUCTION WITH PLACEMENT OF TISSUE EXPANDER AND FLEX HD (ACELLULAR HYDRATED DERMIS) Bilateral 11/30/2019   Procedure: BREAST RECONSTRUCTION WITH PLACEMENT OF TISSUE EXPANDER AND FLEX HD (ACELLULAR HYDRATED DERMIS);  Surgeon: Wallace Going, DO;  Location: [place];  Service: Plastics;  Laterality: Bilateral;  . CESAREAN SECTION     x 1  . MASTECTOMY W/ SENTINEL NODE BIOPSY Bilateral 11/30/2019   Procedure: BILATERAL MASTECTOMY WITH LEFT AXILLARY SENTINEL LYMPH NODE BIOPSY;  Surgeon: Alphonsa Overall, MD;  Location: [place];  Service: General;  Laterality: Bilateral;  BIL PEC BLOCK  . WISDOM TOOTH EXTRACTION      Social History: Social History   Socioeconomic History  . Marital status: Married    Spouse name: Not on file  . Number of children: Not on file  . Years of  education: Not on file  . Highest education level: Not on file  Occupational History  . Not on file  Tobacco Use  . Smoking status: Never Smoker  . Smokeless tobacco: Never Used  Vaping Use  . Vaping Use: Never used  Substance and Sexual Activity  . Alcohol use: No  . Drug use: No  . Sexual activity: Yes    Birth control/protection: Condom  Other Topics Concern  . Not on file  Social History Narrative  . Not on file   Social Determinants of Health   Financial Resource Strain:   . Difficulty of Paying Living Expenses:   Food Insecurity:   . Worried About Sales executive in the Last Year:   . Arboriculturist in the Last Year:   Transportation Needs:   . Film/video editor (Medical):   Marland Kitchen Lack of Transportation (Non-Medical):   Physical Activity:   . Days of Exercise per Week:   . Minutes of Exercise per Session:   Stress:   . Feeling of Stress :   Social Connections:   . Frequency of Communication with Friends and Family:   . Frequency of Social Gatherings with Friends and Family:   . Attends Religious Services:   . Active Member of Clubs or Organizations:   . Attends Archivist Meetings:   Marland Kitchen Marital Status:   Intimate Partner Violence:   . Fear of Current or Ex-Partner:   . Emotionally Abused:   Marland Kitchen Physically Abused:   . Sexually Abused:     Family History: History reviewed. No pertinent family history.  Review of Systems: Review of Systems  Constitutional: Negative for chills and fever.  HENT: Negative for congestion and sore throat.   Respiratory: Negative for cough and shortness of breath.   Cardiovascular: Negative for chest pain and palpitations.  Gastrointestinal: Negative for abdominal pain, nausea and vomiting.  Musculoskeletal: Negative for back pain, joint pain, myalgias and neck pain.  Skin: Negative for itching and rash.    Physical Exam: Vital Signs BP 117/76 (BP Location: Right Arm, Patient Position: Sitting, Cuff Size: Normal)   Pulse 80   Temp 98.3 F (36.8 C) (Temporal)   LMP 01/30/2020 (Exact Date)   SpO2 100%  Physical Exam Vitals and nursing note reviewed.  Constitutional:      Appearance: Normal appearance. She is normal weight.  HENT:     Head: Normocephalic and atraumatic.  Eyes:     Extraocular Movements: Extraocular movements intact.  Cardiovascular:     Rate and Rhythm: Normal rate and regular rhythm.     Pulses: Normal pulses.     Heart sounds: Normal heart sounds.  Pulmonary:     Effort: Pulmonary effort is normal.     Breath sounds: Normal breath sounds. No wheezing, rhonchi or  rales.  Chest:       Comments: Well healed mastectomy incisions; c/d/i. No signs of infection, redness, drainage, seroma/hematoma Abdominal:     General: Bowel sounds are normal.     Palpations: Abdomen is soft.  Musculoskeletal:        General: No swelling. Normal range of motion.     Cervical back: Normal range of motion.  Skin:    General: Skin is warm and dry.     Coloration: Skin is not pale.     Findings: No erythema or rash.  Neurological:     General: No focal deficit present.     Mental Status: She is alert  and oriented to person, place, and time.  Psychiatric:        Mood and Affect: Mood normal.        Behavior: Behavior normal.        Thought Content: Thought content normal.        Judgment: Judgment normal.     Assessment/Plan:  Ms. Kennerson scheduled for removal of bilateral tissue expanders and placement of silicone implants with Dr. Marla Roe.  Risks, benefits, and alternatives of procedure discussed, questions answered and consent obtained.    Smoking Status: non-smoker; Counseling Given? N/A Last Mammogram: N/A-bilateral mastectomy  Caprini Score: 6 high; Risk Factors include: 44 year old female, Hx breast cancer, BMI >25, and length of planned surgery. Recommendation for mechanical and pharmacological prophylaxis during surgery. Encourage early ambulation.   Pictures obtained: 01/12/20  Post-op Rx sent to pharmacy: Keflex Patient reports they still have Norco and Zofran from the previous surgery  Patient was provided with the General Surgical Risk consent document and Pain Medication Agreement prior to their appointment.  They had adequate time to read through the risk consent documents and Pain Medication Agreement. We also discussed them in person together during this preop appointment. All of their questions were answered to their satisfaction.  Recommended calling if they have any further questions.  Risk consent form and Pain Medication Agreement to be  scanned into patient's chart.  The [place] was signed into law in 2016 which includes the topic of electronic health records.  This provides immediate access to information in MyChart.  This includes consultation notes, operative notes, office notes, lab results and pathology reports.  If you have any questions about what you read please let us know at your next visit or call us at the office.  We are right here with you.   Electronically signed by: Threasa Heads, PA-C 02/16/2020 12:22 PM

## 2020-02-14 NOTE — H&P (View-Only) (Signed)
ICD-10-CM   1. Acquired absence of breast and absent nipple, bilateral  Z90.13   2. Malignant neoplasm of upper-outer quadrant of left breast in female, estrogen receptor negative [facility])  C50.412    Z17.1       Patient ID: Rita Ballard, female    DOB: 1976/02/02, 44 y.o.   MRN: [id number]   History of Present Illness: Rita Ballard is a 44 y.o.  female  with a history of left breast cancer ER negative.  She presents for preoperative evaluation for upcoming procedure, bilateral removal of tissue expanders and placement of silicone implants, scheduled for 02/29/2020 with Dr. Marla Roe.  Summary from previous visit: Patient has been undergoing expansion of bilateral breast implants after mastectomy.  She has been undergoing physical therapy and it has been helping with her range of motion and strength.  Implants will need to be medialized at time of exchange.  Total saline placed in the expanders: Right: 360 / 300 cc Left:   360 / 300 cc  Job: [employer] check[employer]in  PMH Significant for: Endometriosis, scoliosis (as a child)  The patient has had problems with anesthesia. Reports she had NO problems with anesthesia at her last surgery. She reports she was slow to wake up with general anesthesia with a c-section performed in the [place] several years ago, but no issued with her surgery here in the [place].   Past Medical History: Allergies: No Known Allergies  Current Medications:  Current Outpatient Medications:  .  diazepam (VALIUM) 2 MG tablet, Take 1 tablet (2 mg total) by mouth every 12 (twelve) hours as needed for anxiety., Disp: 20 tablet, Rfl: 0 .  Multiple Vitamin (MULTIVITAMIN) capsule, Take 1 capsule by mouth daily., Disp:  , Rfl:  .  Omega-3 Fatty Acids (FISH OIL) 1000 MG CPDR, Take 1 capsule by mouth daily., Disp: , Rfl:  .  ondansetron (ZOFRAN) 4 MG tablet, Take 1 tablet (4 mg total) by mouth every 8 (eight) hours as needed for nausea or vomiting. (Patient not taking:  Reported on 01/12/2020), Disp: 20 tablet, Rfl: 0  Past Medical Problems: Past Medical History:  Diagnosis Date  . Cancer ([place]) 2021   Ductal left breast -upper left quad  . Complication of anesthesia    slow to wake up with general anesthesia with C/S - no epidural or spinal  . History of heavy vaginal bleeding    Dr Cletis Media - will do hysterectomy after patient recovers from breast ca surgery  . Hx of endometriosis   . Hx of scoliosis    As a child    Past Surgical History: Past Surgical History:  Procedure Laterality Date  . BREAST RECONSTRUCTION WITH PLACEMENT OF TISSUE EXPANDER AND FLEX HD (ACELLULAR HYDRATED DERMIS) Bilateral 11/30/2019   Procedure: BREAST RECONSTRUCTION WITH PLACEMENT OF TISSUE EXPANDER AND FLEX HD (ACELLULAR HYDRATED DERMIS);  Surgeon: Wallace Going, DO;  Location: [place];  Service: Plastics;  Laterality: Bilateral;  . CESAREAN SECTION     x 1  . MASTECTOMY W/ SENTINEL NODE BIOPSY Bilateral 11/30/2019   Procedure: BILATERAL MASTECTOMY WITH LEFT AXILLARY SENTINEL LYMPH NODE BIOPSY;  Surgeon: Alphonsa Overall, MD;  Location: [place];  Service: General;  Laterality: Bilateral;  BIL PEC BLOCK  . WISDOM TOOTH EXTRACTION      Social History: Social History   Socioeconomic History  . Marital status: Married    Spouse name: Not on file  . Number of children: Not on file  . Years of  education: Not on file  . Highest education level: Not on file  Occupational History  . Not on file  Tobacco Use  . Smoking status: Never Smoker  . Smokeless tobacco: Never Used  Vaping Use  . Vaping Use: Never used  Substance and Sexual Activity  . Alcohol use: No  . Drug use: No  . Sexual activity: Yes    Birth control/protection: Condom  Other Topics Concern  . Not on file  Social History Narrative  . Not on file   Social Determinants of Health   Financial Resource Strain:   . Difficulty of Paying Living Expenses:   Food Insecurity:   . Worried About Sales executive in the Last Year:   . Arboriculturist in the Last Year:   Transportation Needs:   . Film/video editor (Medical):   Marland Kitchen Lack of Transportation (Non-Medical):   Physical Activity:   . Days of Exercise per Week:   . Minutes of Exercise per Session:   Stress:   . Feeling of Stress :   Social Connections:   . Frequency of Communication with Friends and Family:   . Frequency of Social Gatherings with Friends and Family:   . Attends Religious Services:   . Active Member of Clubs or Organizations:   . Attends Archivist Meetings:   Marland Kitchen Marital Status:   Intimate Partner Violence:   . Fear of Current or Ex-Partner:   . Emotionally Abused:   Marland Kitchen Physically Abused:   . Sexually Abused:     Family History: History reviewed. No pertinent family history.  Review of Systems: Review of Systems  Constitutional: Negative for chills and fever.  HENT: Negative for congestion and sore throat.   Respiratory: Negative for cough and shortness of breath.   Cardiovascular: Negative for chest pain and palpitations.  Gastrointestinal: Negative for abdominal pain, nausea and vomiting.  Musculoskeletal: Negative for back pain, joint pain, myalgias and neck pain.  Skin: Negative for itching and rash.    Physical Exam: Vital Signs BP 117/76 (BP Location: Right Arm, Patient Position: Sitting, Cuff Size: Normal)   Pulse 80   Temp 98.3 F (36.8 C) (Temporal)   LMP 01/30/2020 (Exact Date)   SpO2 100%  Physical Exam Vitals and nursing note reviewed.  Constitutional:      Appearance: Normal appearance. She is normal weight.  HENT:     Head: Normocephalic and atraumatic.  Eyes:     Extraocular Movements: Extraocular movements intact.  Cardiovascular:     Rate and Rhythm: Normal rate and regular rhythm.     Pulses: Normal pulses.     Heart sounds: Normal heart sounds.  Pulmonary:     Effort: Pulmonary effort is normal.     Breath sounds: Normal breath sounds. No wheezing, rhonchi or  rales.  Chest:       Comments: Well healed mastectomy incisions; c/d/i. No signs of infection, redness, drainage, seroma/hematoma Abdominal:     General: Bowel sounds are normal.     Palpations: Abdomen is soft.  Musculoskeletal:        General: No swelling. Normal range of motion.     Cervical back: Normal range of motion.  Skin:    General: Skin is warm and dry.     Coloration: Skin is not pale.     Findings: No erythema or rash.  Neurological:     General: No focal deficit present.     Mental Status: She is alert  and oriented to person, place, and time.  Psychiatric:        Mood and Affect: Mood normal.        Behavior: Behavior normal.        Thought Content: Thought content normal.        Judgment: Judgment normal.     Assessment/Plan:  Ms. Nakagawa scheduled for removal of bilateral tissue expanders and placement of silicone implants with Dr. Marla Roe.  Risks, benefits, and alternatives of procedure discussed, questions answered and consent obtained.    Smoking Status: non-smoker; Counseling Given? N/A Last Mammogram: N/A-bilateral mastectomy  Caprini Score: 6 high; Risk Factors include: 44 year old female, Hx breast cancer, BMI >25, and length of planned surgery. Recommendation for mechanical and pharmacological prophylaxis during surgery. Encourage early ambulation.   Pictures obtained: 01/12/20  Post-op Rx sent to pharmacy: Keflex Patient reports they still have Norco and Zofran from the previous surgery  Patient was provided with the General Surgical Risk consent document and Pain Medication Agreement prior to their appointment.  They had adequate time to read through the risk consent documents and Pain Medication Agreement. We also discussed them in person together during this preop appointment. All of their questions were answered to their satisfaction.  Recommended calling if they have any further questions.  Risk consent form and Pain Medication Agreement to be  scanned into patient's chart.  The [place] was signed into law in 2016 which includes the topic of electronic health records.  This provides immediate access to information in MyChart.  This includes consultation notes, operative notes, office notes, lab results and pathology reports.  If you have any questions about what you read please let us know at your next visit or call us at the office.  We are right here with you.   Electronically signed by: Threasa Heads, PA-C 02/16/2020 12:22 PM

## 2020-02-16 ENCOUNTER — Ambulatory Visit (INDEPENDENT_AMBULATORY_CARE_PROVIDER_SITE_OTHER): Payer: 59 | Admitting: Plastic Surgery

## 2020-02-16 ENCOUNTER — Encounter: Payer: Self-pay | Admitting: Plastic Surgery

## 2020-02-16 ENCOUNTER — Other Ambulatory Visit: Payer: Self-pay

## 2020-02-16 VITALS — BP 117/76 | HR 80 | Temp 98.3°F

## 2020-02-16 DIAGNOSIS — C50412 Malignant neoplasm of upper-outer quadrant of left female breast: Secondary | ICD-10-CM

## 2020-02-16 DIAGNOSIS — Z9013 Acquired absence of bilateral breasts and nipples: Secondary | ICD-10-CM

## 2020-02-16 DIAGNOSIS — Z171 Estrogen receptor negative status [ER-]: Secondary | ICD-10-CM

## 2020-02-16 MED ORDER — CEPHALEXIN 500 MG PO CAPS: 500.0000 mg | ORAL_CAPSULE | Freq: Four times a day (QID) | ORAL | 0 refills | Status: AC

## 2020-02-22 ENCOUNTER — Encounter (HOSPITAL_BASED_OUTPATIENT_CLINIC_OR_DEPARTMENT_OTHER): Payer: Self-pay | Admitting: Plastic Surgery

## 2020-02-22 ENCOUNTER — Other Ambulatory Visit: Payer: Self-pay

## 2020-02-26 ENCOUNTER — Other Ambulatory Visit (HOSPITAL_COMMUNITY)
Admission: RE | Admit: 2020-02-26 | Discharge: 2020-02-26 | Disposition: A | Discharge status: Home / Self Care | Payer: 59 | Source: Ambulatory Visit | Attending: Plastic Surgery | Admitting: Plastic Surgery

## 2020-02-26 DIAGNOSIS — Z20822 Contact with and (suspected) exposure to covid-19: Secondary | ICD-10-CM | POA: Diagnosis not present

## 2020-02-26 DIAGNOSIS — Z01812 Encounter for preprocedural laboratory examination: Secondary | ICD-10-CM | POA: Diagnosis present

## 2020-02-26 LAB — SARS CORONAVIRUS 2 (TAT 6-24 HRS): SARS Coronavirus 2: NEGATIVE

## 2020-02-29 ENCOUNTER — Ambulatory Visit (HOSPITAL_BASED_OUTPATIENT_CLINIC_OR_DEPARTMENT_OTHER): Payer: 59 | Admitting: Anesthesiology

## 2020-02-29 ENCOUNTER — Ambulatory Visit (HOSPITAL_BASED_OUTPATIENT_CLINIC_OR_DEPARTMENT_OTHER)
Admission: RE | Admit: 2020-02-29 | Discharge: 2020-02-29 | Disposition: A | Discharge status: Home / Self Care | Payer: 59 | Attending: Plastic Surgery | Admitting: Plastic Surgery

## 2020-02-29 ENCOUNTER — Encounter (HOSPITAL_BASED_OUTPATIENT_CLINIC_OR_DEPARTMENT_OTHER)
Admission: RE | Disposition: A | Discharge status: Home / Self Care | Payer: Self-pay | Source: Home / Self Care | Attending: Plastic Surgery

## 2020-02-29 ENCOUNTER — Encounter (HOSPITAL_BASED_OUTPATIENT_CLINIC_OR_DEPARTMENT_OTHER): Payer: Self-pay | Admitting: Plastic Surgery

## 2020-02-29 DIAGNOSIS — Z853 Personal history of malignant neoplasm of breast: Secondary | ICD-10-CM | POA: Diagnosis not present

## 2020-02-29 DIAGNOSIS — Z9013 Acquired absence of bilateral breasts and nipples: Secondary | ICD-10-CM | POA: Insufficient documentation

## 2020-02-29 DIAGNOSIS — C50412 Malignant neoplasm of upper-outer quadrant of left female breast: Secondary | ICD-10-CM | POA: Diagnosis not present

## 2020-02-29 DIAGNOSIS — Z79899 Other long term (current) drug therapy: Secondary | ICD-10-CM | POA: Insufficient documentation

## 2020-02-29 DIAGNOSIS — Z171 Estrogen receptor negative status [ER-]: Secondary | ICD-10-CM | POA: Diagnosis not present

## 2020-02-29 HISTORY — PX: REMOVAL OF BILATERAL TISSUE EXPANDERS WITH PLACEMENT OF BILATERAL BREAST IMPLANTS: SHX6431

## 2020-02-29 LAB — POCT PREGNANCY, URINE: Preg Test, Ur: NEGATIVE

## 2020-02-29 SURGERY — REMOVAL, TISSUE EXPANDER, BREAST, BILATERAL, WITH BILATERAL IMPLANT IMPLANT INSERTION
Anesthesia: General | Site: Breast | Laterality: Bilateral

## 2020-02-29 MED ORDER — DIPHENHYDRAMINE HCL 50 MG/ML IJ SOLN
INTRAMUSCULAR | Status: DC | PRN
  Administered 2020-02-29: 6.25 mg via INTRAVENOUS

## 2020-02-29 MED ORDER — OXYCODONE HCL 5 MG/5ML PO SOLN: 5.0000 mg | Freq: Once | ORAL | Status: DC | PRN

## 2020-02-29 MED ORDER — BUPIVACAINE HCL (PF) 0.25 % IJ SOLN
INTRAMUSCULAR | Status: DC | PRN
  Administered 2020-02-29: 10 mL

## 2020-02-29 MED ORDER — SODIUM CHLORIDE 0.9% FLUSH: 3.0000 mL | Freq: Two times a day (BID) | INTRAVENOUS | Status: DC

## 2020-02-29 MED ORDER — LACTATED RINGERS IV SOLN: INTRAVENOUS | Status: DC

## 2020-02-29 MED ORDER — ACETAMINOPHEN 325 MG PO TABS: 650.0000 mg | ORAL_TABLET | ORAL | Status: DC | PRN

## 2020-02-29 MED ORDER — LIDOCAINE 2% (20 MG/ML) 5 ML SYRINGE
INTRAMUSCULAR | Status: AC
  Filled 2020-02-29: qty 5

## 2020-02-29 MED ORDER — SODIUM CHLORIDE 0.9% FLUSH: 3.0000 mL | INTRAVENOUS | Status: DC | PRN

## 2020-02-29 MED ORDER — OXYCODONE HCL 5 MG PO TABS: 5.0000 mg | ORAL_TABLET | Freq: Once | ORAL | Status: DC | PRN

## 2020-02-29 MED ORDER — ACETAMINOPHEN 500 MG PO TABS: 1000.0000 mg | ORAL_TABLET | Freq: Once | ORAL | Status: DC | PRN

## 2020-02-29 MED ORDER — ONDANSETRON HCL 4 MG/2ML IJ SOLN
INTRAMUSCULAR | Status: AC
  Filled 2020-02-29: qty 2

## 2020-02-29 MED ORDER — PROPOFOL 10 MG/ML IV BOLUS
INTRAVENOUS | Status: DC | PRN
  Administered 2020-02-29: 150 mg via INTRAVENOUS

## 2020-02-29 MED ORDER — DEXAMETHASONE SODIUM PHOSPHATE 10 MG/ML IJ SOLN
INTRAMUSCULAR | Status: AC
  Filled 2020-02-29: qty 1

## 2020-02-29 MED ORDER — ACETAMINOPHEN 10 MG/ML IV SOLN: 1000.0000 mg | Freq: Once | INTRAVENOUS | Status: DC | PRN

## 2020-02-29 MED ORDER — OXYCODONE HCL 5 MG PO TABS: 5.0000 mg | ORAL_TABLET | ORAL | Status: DC | PRN

## 2020-02-29 MED ORDER — SODIUM CHLORIDE 0.9 % IV SOLN
INTRAVENOUS | Status: DC | PRN
  Administered 2020-02-29: 1000 mL

## 2020-02-29 MED ORDER — ACETAMINOPHEN 325 MG RE SUPP: 650.0000 mg | RECTAL | Status: DC | PRN

## 2020-02-29 MED ORDER — EPHEDRINE 5 MG/ML INJ
INTRAVENOUS | Status: AC
  Filled 2020-02-29: qty 10

## 2020-02-29 MED ORDER — FENTANYL CITRATE (PF) 100 MCG/2ML IJ SOLN
INTRAMUSCULAR | Status: DC | PRN
  Administered 2020-02-29 (×2): 25 ug via INTRAVENOUS
  Administered 2020-02-29: 100 ug via INTRAVENOUS

## 2020-02-29 MED ORDER — FENTANYL CITRATE (PF) 100 MCG/2ML IJ SOLN: 25.0000 ug | INTRAMUSCULAR | Status: DC | PRN

## 2020-02-29 MED ORDER — CEFAZOLIN SODIUM-DEXTROSE 2-4 GM/100ML-% IV SOLN
2.0000 g | INTRAVENOUS | Status: AC
  Administered 2020-02-29: 2 g via INTRAVENOUS

## 2020-02-29 MED ORDER — MIDAZOLAM HCL 5 MG/5ML IJ SOLN
INTRAMUSCULAR | Status: DC | PRN
  Administered 2020-02-29: 2 mg via INTRAVENOUS

## 2020-02-29 MED ORDER — MORPHINE SULFATE (PF) 4 MG/ML IV SOLN: 2.0000 mg | INTRAVENOUS | Status: DC | PRN

## 2020-02-29 MED ORDER — DEXAMETHASONE SODIUM PHOSPHATE 4 MG/ML IJ SOLN
INTRAMUSCULAR | Status: DC | PRN
  Administered 2020-02-29: 10 mg via INTRAVENOUS

## 2020-02-29 MED ORDER — MIDAZOLAM HCL 2 MG/2ML IJ SOLN
INTRAMUSCULAR | Status: AC
  Filled 2020-02-29: qty 2

## 2020-02-29 MED ORDER — LIDOCAINE-EPINEPHRINE 1 %-1:100000 IJ SOLN
INTRAMUSCULAR | Status: DC | PRN
  Administered 2020-02-29: 10 mL

## 2020-02-29 MED ORDER — SODIUM CHLORIDE 0.9 % IV SOLN: 250.0000 mL | INTRAVENOUS | Status: DC | PRN

## 2020-02-29 MED ORDER — FENTANYL CITRATE (PF) 100 MCG/2ML IJ SOLN
INTRAMUSCULAR | Status: AC
  Filled 2020-02-29: qty 2

## 2020-02-29 MED ORDER — SUCCINYLCHOLINE CHLORIDE 200 MG/10ML IV SOSY
PREFILLED_SYRINGE | INTRAVENOUS | Status: AC
  Filled 2020-02-29: qty 10

## 2020-02-29 MED ORDER — PHENYLEPHRINE 40 MCG/ML (10ML) SYRINGE FOR IV PUSH (FOR BLOOD PRESSURE SUPPORT)
PREFILLED_SYRINGE | INTRAVENOUS | Status: AC
  Filled 2020-02-29: qty 10

## 2020-02-29 MED ORDER — CEFAZOLIN SODIUM-DEXTROSE 2-4 GM/100ML-% IV SOLN
INTRAVENOUS | Status: AC
  Filled 2020-02-29: qty 100

## 2020-02-29 MED ORDER — CHLORHEXIDINE GLUCONATE CLOTH 2 % EX PADS: 6.0000 | MEDICATED_PAD | Freq: Once | CUTANEOUS | Status: DC

## 2020-02-29 MED ORDER — ONDANSETRON HCL 4 MG/2ML IJ SOLN
INTRAMUSCULAR | Status: DC | PRN
  Administered 2020-02-29: 4 mg via INTRAVENOUS

## 2020-02-29 MED ORDER — ACETAMINOPHEN 160 MG/5ML PO SOLN: 1000.0000 mg | Freq: Once | ORAL | Status: DC | PRN

## 2020-02-29 MED ORDER — LIDOCAINE HCL (CARDIAC) PF 100 MG/5ML IV SOSY
PREFILLED_SYRINGE | INTRAVENOUS | Status: DC | PRN
  Administered 2020-02-29: 100 mg via INTRAVENOUS

## 2020-02-29 SURGICAL SUPPLY — 66 items
ADH SKN CLS APL DERMABOND .7 (GAUZE/BANDAGES/DRESSINGS) ×2
BAG DECANTER FOR FLEXI CONT (MISCELLANEOUS) ×2 IMPLANT
BINDER BREAST LRG (GAUZE/BANDAGES/DRESSINGS) IMPLANT
BINDER BREAST MEDIUM (GAUZE/BANDAGES/DRESSINGS) IMPLANT
BINDER BREAST XLRG (GAUZE/BANDAGES/DRESSINGS) IMPLANT
BINDER BREAST XXLRG (GAUZE/BANDAGES/DRESSINGS) IMPLANT
BIOPATCH RED 1 DISK 7.0 (GAUZE/BANDAGES/DRESSINGS) IMPLANT
BLADE HEX COATED 2.75 (ELECTRODE) ×2 IMPLANT
BLADE SURG 15 STRL LF DISP TIS (BLADE) ×2 IMPLANT
BLADE SURG 15 STRL SS (BLADE) ×4
CANISTER SUCT 1200ML W/VALVE (MISCELLANEOUS) ×2 IMPLANT
CORD BIPOLAR FORCEPS 12FT (ELECTRODE) IMPLANT
COVER BACK TABLE 60X90IN (DRAPES) ×2 IMPLANT
COVER MAYO STAND STRL (DRAPES) ×2 IMPLANT
COVER WAND RF STERILE (DRAPES) IMPLANT
DECANTER SPIKE VIAL GLASS SM (MISCELLANEOUS) IMPLANT
DERMABOND ADVANCED (GAUZE/BANDAGES/DRESSINGS) ×2
DERMABOND ADVANCED .7 DNX12 (GAUZE/BANDAGES/DRESSINGS) ×2 IMPLANT
DRAIN CHANNEL 19F RND (DRAIN) IMPLANT
DRAPE LAPAROSCOPIC ABDOMINAL (DRAPES) ×2 IMPLANT
DRSG OPSITE POSTOP 4X6 (GAUZE/BANDAGES/DRESSINGS) IMPLANT
DRSG PAD ABDOMINAL 8X10 ST (GAUZE/BANDAGES/DRESSINGS) ×4 IMPLANT
ELECT BLADE 4.0 EZ CLEAN MEGAD (MISCELLANEOUS) ×2
ELECT REM PT RETURN 9FT ADLT (ELECTROSURGICAL) ×2
ELECTRODE BLDE 4.0 EZ CLN MEGD (MISCELLANEOUS) ×1 IMPLANT
ELECTRODE REM PT RTRN 9FT ADLT (ELECTROSURGICAL) ×1 IMPLANT
EVACUATOR SILICONE 100CC (DRAIN) IMPLANT
FUNNEL KELLER 2 DISP (MISCELLANEOUS) ×2 IMPLANT
GAUZE SPONGE 4X4 12PLY STRL LF (GAUZE/BANDAGES/DRESSINGS) IMPLANT
GLOVE BIO SURGEON STRL SZ 6.5 (GLOVE) ×8 IMPLANT
GLOVE BIO SURGEON STRL SZ7 (GLOVE) ×4 IMPLANT
GOWN STRL REUS W/ TWL LRG LVL3 (GOWN DISPOSABLE) ×3 IMPLANT
GOWN STRL REUS W/TWL LRG LVL3 (GOWN DISPOSABLE) ×6
IMPL BREAST GEL HP 400CC (Breast) ×2 IMPLANT
IMPLANT BREAST GEL HP 400CC (Breast) ×4 IMPLANT
IV NS 1000ML (IV SOLUTION)
IV NS 1000ML BAXH (IV SOLUTION) IMPLANT
IV NS 500ML (IV SOLUTION)
IV NS 500ML BAXH (IV SOLUTION) IMPLANT
KIT FILL SYSTEM UNIVERSAL (SET/KITS/TRAYS/PACK) IMPLANT
NDL SAFETY ECLIPSE 18X1.5 (NEEDLE) ×1 IMPLANT
NEEDLE HYPO 18GX1.5 SHARP (NEEDLE) ×2
NEEDLE HYPO 25X1 1.5 SAFETY (NEEDLE) ×2 IMPLANT
PACK BASIN DAY SURGERY FS (CUSTOM PROCEDURE TRAY) ×2 IMPLANT
PENCIL SMOKE EVACUATOR (MISCELLANEOUS) ×2 IMPLANT
PIN SAFETY STERILE (MISCELLANEOUS) IMPLANT
SIZER BREAST REUSE 400CC (SIZER) ×2
SIZER BRST REUSE 400CC (SIZER) ×1 IMPLANT
SLEEVE SCD COMPRESS KNEE MED (MISCELLANEOUS) ×2 IMPLANT
SPONGE LAP 18X18 RF (DISPOSABLE) ×4 IMPLANT
STRIP SUTURE WOUND CLOSURE 1/2 (MISCELLANEOUS) IMPLANT
SUT MNCRL AB 4-0 PS2 18 (SUTURE) ×6 IMPLANT
SUT MON AB 3-0 SH 27 (SUTURE) ×8
SUT MON AB 3-0 SH27 (SUTURE) ×4 IMPLANT
SUT MON AB 5-0 PS2 18 (SUTURE) ×2 IMPLANT
SUT PDS AB 2-0 CT2 27 (SUTURE) IMPLANT
SUT VIC AB 3-0 SH 27 (SUTURE)
SUT VIC AB 3-0 SH 27X BRD (SUTURE) IMPLANT
SUT VICRYL 4-0 PS2 18IN ABS (SUTURE) IMPLANT
SYR BULB IRRIG 60ML STRL (SYRINGE) ×2 IMPLANT
SYR CONTROL 10ML LL (SYRINGE) ×2 IMPLANT
TOWEL GREEN STERILE FF (TOWEL DISPOSABLE) ×4 IMPLANT
TRAY DSU PREP LF (CUSTOM PROCEDURE TRAY) ×2 IMPLANT
TUBE CONNECTING 20X1/4 (TUBING) ×2 IMPLANT
UNDERPAD 30X36 HEAVY ABSORB (UNDERPADS AND DIAPERS) ×4 IMPLANT
YANKAUER SUCT BULB TIP NO VENT (SUCTIONS) ×2 IMPLANT

## 2020-02-29 NOTE — Op Note (Signed)
Op report Bilateral Exchange   DATE OF OPERATION: 02/29/2020  LOCATION: [place]  SURGICAL DIVISION: Plastic Surgery  PREOPERATIVE DIAGNOSES:  1.History of breast cancer.  2. Acquired absence of bilateral breast.   POSTOPERATIVE DIAGNOSES:  1. History of breast cancer.  2. Acquired absence of bilateral breast.   PROCEDURE:  1. Bilateral exchange of tissue expanders for implants.  2. Bilateral capsulotomies for implant respositioning.  SURGEON: Blaine Guiffre Sanger Kenzley Ke, DO  ASSISTANT: Phoebe Sharps, PA  ANESTHESIA:  General.   COMPLICATIONS: None.   IMPLANTS: Left - Mentor Smooth Round Ultra High Profile Gel 400cc. Ref #[id number].  Serial Number [id number] Right - Mentor Smooth Round Ultra High Profile Gel 400cc. Ref #[id number].  Serial Number [id number]  INDICATIONS FOR PROCEDURE:  The patient, Rita Ballard, is a 44 y.o. female born on 01-Dec-1975, is here for treatment after bialteral mastectomies.  She had tissue expanders placed at the time of mastectomies. She now presents for exchange of her expanders for implants.  She requires capsulotomies to better position the implants. MRN: [id number]  CONSENT:  Informed consent was obtained directly from the patient. Risks, benefits and alternatives were fully discussed. Specific risks including but not limited to bleeding, infection, hematoma, seroma, scarring, pain, implant infection, implant extrusion, capsular contracture, asymmetry, wound healing problems, and need for further surgery were all discussed. The patient did have an ample opportunity to have her questions answered to her satisfaction.   DESCRIPTION OF PROCEDURE:  The patient was taken to the operating room. SCDs were placed and IV antibiotics were given. The patient's chest was prepped and draped in a sterile fashion. A time out was performed and the implants to be used were identified.    On the right breast: One percent Lidocaine with  epinephrine was used to infiltrate at the incision site. The old mastectomy scar was excised.  The mastectomy flaps from the superior and inferior flaps were raised over the pectoralis major muscle for several centimeters to minimize tension for the closure. The pectoralis was split inferior to the skin incision to expose and remove the tissue expander.  Inspection of the pocket showed a normal healthy capsule and good integration of the biologic matrix.  The pocket was irrigated with antibiotic solution.  Circumferential capsulotomies were performed to allow for breast pocket expansion.  Measurements were made and a sizer used to confirm adequate pocket size for the implant dimensions.  Hemostasis was ensured with electrocautery. New gloves were placed. The implant was soaked in antibiotic solution and then placed in the pocket and oriented appropriately. The pectoralis major muscle and capsule on the anterior surface were re-closed with a 3-0 Monocryl suture. The remaining skin was closed with 4-0 Monocryl deep dermal and 5-0 Monocryl subcuticular stitches.   On the left breast: The old mastectomy scar was excised.  The mastectomy flaps from the superior and inferior flaps were raised over the pectoralis major muscle for several centimeters to minimize tension for the closure. The pectoralis was split inferior to the skin incision to expose and remove the tissue expander.  Inspection of the pocket showed a normal healthy capsule and good integration of the biologic matrix.   Circumferential capsulotomies were performed to allow for breast pocket expansion.  Measurements were made and a sizer utilized to confirm adequate pocket size for the implant dimensions.  Hemostasis was ensured with the electrocautery.  New gloves were applied. The implant was soaked in antibiotic solution and placed in the pocket and oriented appropriately.  The pectoralis major muscle and capsule on the anterior surface were re-closed  with a 3-0 Monocryl suture. The remaining skin was closed with 4-0 Monocryl deep dermal and 5-0 Monocryl subcuticular stitches.  Dermabond was applied to the incision site. A breast binder and ABDs were placed.  The patient was allowed to wake from anesthesia and taken to the recovery room in satisfactory condition.  The skin was thin and we went as large as possible that was safe.  The advanced practice practitioner (APP) assisted throughout the case.  The APP was essential in retraction and counter traction when needed to make the case progress smoothly.  This retraction and assistance made it possible to see the tissue plans for the procedure.  The assistance was needed for blood control, tissue re-approximation and assisted with closure of the incision site.

## 2020-02-29 NOTE — Discharge Instructions (Addendum)
INSTRUCTIONS FOR AFTER SURGERY   You will likely have some questions about what to expect following your operation.  The following information will help you and your family understand what to expect when you are discharged from the hospital.  Following these guidelines will help ensure a smooth recovery and reduce risks of complications.  Postoperative instructions include information on: diet, wound care, medications and physical activity.  AFTER SURGERY Expect to go home after the procedure.  In some cases, you may need to spend one night in the hospital for observation.  DIET This surgery does not require a specific diet.  However, I have to mention that the healthier you eat the better your body can start healing. It is important to increasing your protein intake.  This means limiting the foods with added sugar.  Focus on fruits and vegetables and some meat.  If you have any liposuction during your procedure be sure to drink water.  If your urine is bright yellow, then it is concentrated, and you need to drink more water.  As a general rule after surgery, you should have 8 ounces of water every hour while awake.  If you find you are persistently nauseated or unable to take in liquids let us know.  NO TOBACCO USE or EXPOSURE.  This will slow your healing process and increase the risk of a wound.  WOUND CARE If you don't have a drain: You can shower the day after surgery.  Use fragrance free soap.  Dial, [place], [place] and Cetaphil are usually mild on the skin.  If you have steri-strips / tape directly attached to your skin leave them in place. It is OK to get these wet.  No baths, pools or hot tubs for two weeks. We close your incision to leave the smallest and best-looking scar. No ointment or creams on your incisions until given the go ahead.  Especially not Neosporin (Too many skin reactions with this one).  A few weeks after surgery you can use Mederma and start massaging the scar. We ask you to  wear your sports bra for the first 6 weeks around the clock, including while sleeping. This provides added comfort and helps reduce the fluid accumulation at the surgery site.  ACTIVITY No heavy lifting until cleared by the doctor.  It is OK to walk and climb stairs. In fact, moving your legs is very important to decrease your risk of a blood clot.  It will also help keep you from getting deconditioned.  Every 1 to 2 hours get up and walk for 5 minutes. This will help with a quicker recovery back to normal.  Let pain be your guide so you don't do too much.  NO, you cannot do the spring cleaning and don't plan on taking care of anyone else.  This is your time for TLC.   WORK Everyone returns to work at different times. As a rough guide, most people take at least 1 - 2 weeks off prior to returning to work. If you need documentation for your job, bring the forms to your postoperative follow up visit.  DRIVING Arrange for someone to bring you home from the hospital.  You may be able to drive a few days after surgery but not while taking any narcotics or valium.  BOWEL MOVEMENTS Constipation can occur after anesthesia and while taking pain medication.  It is important to stay ahead for your comfort.  We recommend taking Milk of Magnesia (2 tablespoons; twice a day)  while taking the pain pills.  SEROMA This is fluid your body tried to put in the surgical site.  This is normal but if it creates excessive pain and swelling let us know.  It usually decreases in a few weeks.  MEDICATIONS and PAIN CONTROL At your preoperative visit for you history and physical you were given the following medications: 1. An antibiotic: Start this medication when you get home and take according to the instructions on the bottle. 2. Zofran 4 mg:  This is to treat nausea and vomiting.  You can take this every 6 hours as needed and only if needed. 3. Norco (hydrocodone/acetaminophen) 5/325 mg:  This is only to be used after  you have taken the motrin or the tylenol. Every 8 hours as needed. Over the counter Medication to take: 4. Ibuprofen (Motrin) 600 mg:  Take this every 6 hours.  If you have additional pain then take 500 mg of the tylenol.  Only take the Norco after you have tried these two. 5. Miralax or stool softener of choice: Take this according to the bottle if you take the [place] Call your surgeon's office if any of the following occur: . Fever 101 degrees F or greater . Excessive bleeding or fluid from the incision site. . Pain that increases over time without aid from the medications . Redness, warmth, or pus draining from incision sites . Persistent nausea or inability to take in liquids . Severe misshapen area that underwent the operation.     Post Anesthesia Home Care Instructions  Activity: Get plenty of rest for the remainder of the day. A responsible individual must stay with you for 24 hours following the procedure.  For the next 24 hours, DO NOT: -Drive a car -Paediatric nurse -Drink alcoholic beverages -Take any medication unless instructed by your physician -Make any legal decisions or sign important papers.  Meals: Start with liquid foods such as gelatin or soup. Progress to regular foods as tolerated. Avoid greasy, spicy, heavy foods. If nausea and/or vomiting occur, drink only clear liquids until the nausea and/or vomiting subsides. Call your physician if vomiting continues.  Special Instructions/Symptoms: Your throat may feel dry or sore from the anesthesia or the breathing tube placed in your throat during surgery. If this causes discomfort, gargle with warm salt water. The discomfort should disappear within 24 hours.  If you had a scopolamine patch placed behind your ear for the management of post- operative nausea and/or vomiting:  1. The medication in the patch is effective for 72 hours, after which it should be removed.  Wrap patch in a tissue and discard  in the trash. Wash hands thoroughly with soap and water. 2. You may remove the patch earlier than 72 hours if you experience unpleasant side effects which may include dry mouth, dizziness or visual disturbances. 3. Avoid touching the patch. Wash your hands with soap and water after contact with the patch.

## 2020-02-29 NOTE — Anesthesia Procedure Notes (Signed)
Procedure Name: LMA Insertion Date/Time: 02/29/2020 10:42 AM Performed by: Willa Frater, CRNA Pre-anesthesia Checklist: Patient identified, Emergency Drugs available, Suction available and Patient being monitored Patient Re-evaluated:Patient Re-evaluated prior to induction Oxygen Delivery Method: Circle system utilized Preoxygenation: Pre-oxygenation with 100% oxygen Induction Type: IV induction Ventilation: Mask ventilation without difficulty LMA: LMA inserted LMA Size: 4.0 Number of attempts: 1 Airway Equipment and Method: Bite block Placement Confirmation: positive ETCO2 Tube secured with: Tape Dental Injury: Teeth and Oropharynx as per pre-operative assessment

## 2020-02-29 NOTE — Interval H&P Note (Signed)
History and Physical Interval Note:  02/29/2020 10:08 AM  Rita Ballard  has presented today for surgery, with the diagnosis of status post breast reconstruction.  The various methods of treatment have been discussed with the patient and family. After consideration of risks, benefits and other options for treatment, the patient has consented to  Procedure(s) with comments: REMOVAL OF BILATERAL TISSUE EXPANDERS WITH PLACEMENT OF BILATERAL BREAST IMPLANTS (Bilateral) - 2 hours, please as a surgical intervention.  The patient's history has been reviewed, patient examined, no change in status, stable for surgery.  I have reviewed the patient's chart and labs.  Questions were answered to the patient's satisfaction.     Loel Lofty Vianney Kopecky

## 2020-02-29 NOTE — Transfer of Care (Signed)
Immediate Anesthesia Transfer of Care Note  Patient: Rita Ballard  Procedure(s) Performed: REMOVAL OF BILATERAL TISSUE EXPANDERS WITH PLACEMENT OF BILATERAL BREAST IMPLANTS (Bilateral Breast)  Patient Location: PACU  Anesthesia Type:General  Level of Consciousness: awake, alert , oriented, drowsy and patient cooperative  Airway & Oxygen Therapy: Patient Spontanous Breathing and Patient connected to face mask oxygen  Post-op Assessment: Report given to RN and Post -op Vital signs reviewed and stable  Post vital signs: Reviewed and stable  Last Vitals:  Vitals Value Taken Time  BP 124/76 02/29/20 1228  Temp    Pulse 79 02/29/20 1229  Resp 15 02/29/20 1229  SpO2 100 % 02/29/20 1229  Vitals shown include unvalidated device data.  Last Pain:  Vitals:   02/29/20 0915  TempSrc: Oral  PainSc: 0-No pain         Complications: No complications documented.

## 2020-02-29 NOTE — Anesthesia Preprocedure Evaluation (Addendum)
Anesthesia Evaluation  Patient identified by MRN, date of birth, ID band Patient awake    Reviewed: Allergy & Precautions, NPO status , Patient's Chart, lab work & pertinent test results  History of Anesthesia Complications Negative for: history of anesthetic complications  Airway Mallampati: II  TM Distance: >3 FB Neck ROM: Full    Dental  (+) Teeth Intact, Dental Advisory Given   Pulmonary neg pulmonary ROS,    breath sounds clear to auscultation       Cardiovascular negative cardio ROS   Rhythm:Regular Rate:Normal     Neuro/Psych negative neurological ROS  negative psych ROS   GI/Hepatic negative GI ROS, Neg liver ROS,   Endo/Other  negative endocrine ROS  Renal/GU negative Renal ROS     Musculoskeletal negative musculoskeletal ROS (+)   Abdominal   Peds  Hematology negative hematology ROS (+)   Anesthesia Other Findings Left breast CA  Reproductive/Obstetrics                           Anesthesia Physical Anesthesia Plan  ASA: II  Anesthesia Plan: General   Post-op Pain Management:    Induction:   PONV Risk Score and Plan: 3 and Ondansetron and Dexamethasone  Airway Management Planned: LMA  Additional Equipment: None  Intra-op Plan:   Post-operative Plan: Extubation in OR  Informed Consent: I have reviewed the patients History and Physical, chart, labs and discussed the procedure including the risks, benefits and alternatives for the proposed anesthesia with the patient or authorized representative who has indicated his/her understanding and acceptance.     Dental advisory given  Plan Discussed with: CRNA and Surgeon  Anesthesia Plan Comments:         Anesthesia Quick Evaluation

## 2020-02-29 NOTE — Anesthesia Postprocedure Evaluation (Signed)
Anesthesia Post Note  Patient: Rita Ballard  Procedure(s) Performed: REMOVAL OF BILATERAL TISSUE EXPANDERS WITH PLACEMENT OF BILATERAL BREAST IMPLANTS (Bilateral Breast)     Patient location during evaluation: PACU Anesthesia Type: General Level of consciousness: awake and sedated Pain management: pain level controlled Vital Signs Assessment: post-procedure vital signs reviewed and stable Respiratory status: spontaneous breathing Cardiovascular status: stable Postop Assessment: no apparent nausea or vomiting Anesthetic complications: no   No complications documented.  Last Vitals:  Vitals:   02/29/20 1315 02/29/20 1330  BP: 113/72 120/69  Pulse: (!) 58 70  Resp: 15 13  Temp:    SpO2: 99% 100%    Last Pain:  Vitals:   02/29/20 1330  TempSrc:   PainSc: 0-No pain                 Huston Foley

## 2020-03-01 ENCOUNTER — Encounter (HOSPITAL_BASED_OUTPATIENT_CLINIC_OR_DEPARTMENT_OTHER): Payer: Self-pay | Admitting: Plastic Surgery

## 2020-03-01 NOTE — Addendum Note (Signed)
Addendum  created 03/01/20 1136 by Tawni Millers, CRNA   Charge Capture section accepted

## 2020-03-08 ENCOUNTER — Other Ambulatory Visit: Payer: Self-pay

## 2020-03-08 ENCOUNTER — Ambulatory Visit (INDEPENDENT_AMBULATORY_CARE_PROVIDER_SITE_OTHER): Payer: 59 | Admitting: Plastic Surgery

## 2020-03-08 ENCOUNTER — Encounter: Payer: Self-pay | Admitting: Plastic Surgery

## 2020-03-08 VITALS — BP 129/76 | HR 60 | Temp 98.3°F

## 2020-03-08 DIAGNOSIS — Z9013 Acquired absence of bilateral breasts and nipples: Secondary | ICD-10-CM

## 2020-03-08 NOTE — Progress Notes (Signed)
   Subjective:    Patient ID: Rita Ballard, female    DOB: 1976/03/30, 44 y.o.   MRN: [id number]  The patient is a 44 year old female here for follow-up after undergoing exchange of her expanders for bilateral breast implants.  She has 400 ultra high-profile Mentor silicone implants in place.  The incisions are intact and appear to be healing well.  There is no sign infection, hematoma or seroma.  She is pleased so far.  There is a little bit of swelling that will likely improve over the next few weeks.  She may not want to have nipple areolar reconstruction.     Review of Systems  Constitutional: Negative.   Eyes: Negative.   Respiratory: Negative.   Cardiovascular: Negative.   Genitourinary: Negative.        Objective:   Physical Exam Vitals and nursing note reviewed.  Constitutional:      Appearance: Normal appearance.  HENT:     Head: Normocephalic and atraumatic.  Cardiovascular:     Rate and Rhythm: Normal rate.     Pulses: Normal pulses.  Neurological:     General: No focal deficit present.     Mental Status: She is alert. Mental status is at baseline.  Psychiatric:        Mood and Affect: Mood normal.        Behavior: Behavior normal.   Assessment   ICD-10-CM   1. Acquired absence of breast and absent nipple, bilateral  Z90.13     Plan for follow-up in 2 weeks.  Continue to massage inward and downward.

## 2020-03-21 NOTE — Progress Notes (Signed)
Patient is a 44 yr-old female here for follow-up after undergoing exchange of her expanders for bilateral breast implants on 02/29/20 with Dr. Marla Roe. She has Mentor Smooth Round Ultra High Profile Gel 400 cc implants bilaterally.   ~ 3 weeks PO Patient reports overall she is doing well.  She has a bit of 'chest muscle pain' with certain movements and still has some limited range of motion.  Incisions are healing very nicely, C/D/I.  No signs of infection, redness, drainage, seroma/hematoma.  Patient denies fever/chills, nausea/vomiting.  She has a bit of hollowness on the medial aspect of her right breast. Will discuss options at next visit if area remains. Referral placed for PT.  Patient would like to return to [facility] physical therapy that she worked with previously.  She may now begin applying scar cream.  She is to continue to massage inward. Follow up in 4-5 weeks. Call office with any questions/concerns.  The [place] was signed into law in 2016 which includes the topic of electronic health records.  This provides immediate access to information in MyChart.  This includes consultation notes, operative notes, office notes, lab results and pathology reports.  If you have any questions about what you read please let us know at your next visit or call us at the office.  We are right here with you.

## 2020-03-22 ENCOUNTER — Ambulatory Visit (INDEPENDENT_AMBULATORY_CARE_PROVIDER_SITE_OTHER): Payer: 59 | Admitting: Plastic Surgery

## 2020-03-22 ENCOUNTER — Encounter: Payer: Self-pay | Admitting: Plastic Surgery

## 2020-03-22 ENCOUNTER — Other Ambulatory Visit: Payer: Self-pay

## 2020-03-22 VITALS — BP 120/81 | HR 60 | Temp 98.8°F

## 2020-03-22 DIAGNOSIS — Z9889 Other specified postprocedural states: Secondary | ICD-10-CM

## 2020-03-22 DIAGNOSIS — Z719 Counseling, unspecified: Secondary | ICD-10-CM

## 2020-04-08 ENCOUNTER — Other Ambulatory Visit: Payer: Self-pay | Admitting: Obstetrics and Gynecology

## 2020-04-17 ENCOUNTER — Other Ambulatory Visit: Payer: Self-pay

## 2020-04-17 NOTE — Patient Instructions (Addendum)
DUE TO COVID-19 ONLY ONE VISITOR IS ALLOWED TO COME WITH YOU AND STAY IN THE WAITING ROOM ONLY DURING PRE OP AND PROCEDURE DAY OF SURGERY. THE 1 VISITOR  MAY VISIT WITH YOU AFTER SURGERY IN YOUR PRIVATE ROOM DURING VISITING HOURS ONLY!  YOU NEED TO HAVE A COVID 19 TEST ON__10/11____ @__2 :15_____, THIS TEST MUST BE DONE BEFORE SURGERY,  COVID TESTING SITE [address] [place] [address], IT IS ON THE RIGHT GOING OUT [address] APPROXIMATELY  2 MINUTES PAST [employer] ON THE RIGHT. ONCE YOUR COVID TEST IS COMPLETED,  PLEASE BEGIN THE QUARANTINE INSTRUCTIONS AS OUTLINED IN YOUR HANDOUT.                Domonique A Menor    Your procedure is scheduled on:04/25/20    Report to [facility] Main  Entrance   Report to admitting at  10:30 AM     Call this number if you have problems the morning of surgery [phone]    Remember: Do not eat food or drink liquids :After Midnight  . BRUSH YOUR TEETH MORNING OF SURGERY AND RINSE YOUR MOUTH OUT, NO CHEWING GUM CANDY OR MINTS.     Take these medicines the morning of surgery with A SIP OF WATER: None                                 You may not have any metal on your body including hair pins and              piercings  Do not wear jewelry, make-up, lotions, powders or perfumes, deodorant             Do not wear nail polish on your fingernails.  Do not shave  48 hours prior to surgery.     Do not bring valuables to the hospital. [place].  Contacts, dentures or bridgework may not be worn into surgery.       Patients discharged the day of surgery will not be allowed to drive home.   IF YOU ARE HAVING SURGERY AND GOING HOME THE SAME DAY, YOU MUST HAVE AN ADULT TO DRIVE YOU HOME AND BE WITH YOU FOR 24 HOURS.    YOU MAY GO HOME BY TAXI OR UBER OR ORTHERWISE, BUT AN ADULT MUST ACCOMPANY YOU HOME AND STAY WITH YOU FOR 24 HOURS.  Name and phone number of your  driver:  Special Instructions: N/A              Please read over the following fact sheets you were given: _____________________________________________________________________             [facility] - Preparing for Surgery Before surgery, you can play an important role.   Because skin is not sterile, your skin needs to be as free of germs as possible.   You can reduce the number of germs on your skin by washing with CHG (chlorahexidine gluconate) soap before surgery.   CHG is an antiseptic cleaner which kills germs and bonds with the skin to continue killing germs even after washing. Please DO NOT use if you have an allergy to CHG or antibacterial soaps.   If your skin becomes reddened/irritated stop using the CHG and inform your nurse when you arrive at Short Stay. Do  not shave (including legs and underarms) for at least 48 hours prior to the first CHG shower.   Please follow these instructions carefully:  1.  Shower with CHG Soap the night before surgery and the  morning of Surgery.  2.  If you choose to wash your hair, wash your hair first as usual with your  normal  shampoo.  3.  After you shampoo, rinse your hair and body thoroughly to remove the  shampoo.                                        4.  Use CHG as you would any other liquid soap.  You can apply chg directly  to the skin and wash                       Gently with a scrungie or clean washcloth.  5.  Apply the CHG Soap to your body ONLY FROM THE NECK DOWN.   Do not use on face/ open                           Wound or open sores. Avoid contact with eyes, ears mouth and genitals (private parts).                       Wash face,  Genitals (private parts) with your normal soap.             6.  Wash thoroughly, paying special attention to the area where your surgery  will be performed.  7.  Thoroughly rinse your body with warm water from the neck down.  8.  DO NOT shower/wash with your normal soap after using and rinsing off   the CHG Soap.             9.  Pat yourself dry with a clean towel.            10.  Wear clean pajamas.            11.  Place clean sheets on your bed the night of your first shower and do not  sleep with pets. Day of Surgery : Do not apply any lotions/deodorants the morning of surgery.  Please wear clean clothes to the hospital/surgery center.  FAILURE TO FOLLOW THESE INSTRUCTIONS MAY RESULT IN THE CANCELLATION OF YOUR SURGERY PATIENT SIGNATURE_________________________________  NURSE SIGNATURE__________________________________  ________________________________________________________________________ Full Liquid Diet   Strained creamy soups Tea, Coffee- with cream or mild and sugar or honey  Juices- cranberry , grape and apple  Jello  Milkshakes  Pudding , custards  Popsicles  Water Plain ice cream f, frozen yogurt, sherbet, plain yogurt  Fruit ices and popsicles with no fruit pulp  Sugar, honey and syrups Clear broths  Boost, Ensure, Resource and other liquid supplements NO CARBONATED BEVERAGES      CLEAR LIQUID DIET   Foods Allowed                                                                     Foods Excluded  Coffee and tea,  regular and decaf                             liquids that you cannot  Plain Jell-O any favor except red or purple                                           see through such as: Fruit ices (not with fruit pulp)                                     milk, soups, orange juice  Iced Popsicles                                    All solid food Carbonated beverages, regular and diet                                    Cranberry, grape and apple juices Sports drinks like Gatorade Lightly seasoned clear broth or consume(fat free) Sugar, honey syrup  Sample Menu Breakfast                                Lunch                                     Supper Cranberry juice                    Beef broth                            Chicken broth Jell-O                                      Grape juice                           Apple juice Coffee or tea                        Jell-O                                      Popsicle                                                Coffee or tea                        Coffee or tea  _____________________________________________________________________

## 2020-04-18 ENCOUNTER — Encounter (HOSPITAL_COMMUNITY): Payer: Self-pay

## 2020-04-18 ENCOUNTER — Other Ambulatory Visit: Payer: Self-pay

## 2020-04-18 ENCOUNTER — Encounter (HOSPITAL_COMMUNITY)
Admission: RE | Admit: 2020-04-18 | Discharge: 2020-04-18 | Disposition: A | Discharge status: Home / Self Care | Payer: 59 | Source: Ambulatory Visit | Attending: Obstetrics and Gynecology | Admitting: Obstetrics and Gynecology

## 2020-04-18 DIAGNOSIS — Z01812 Encounter for preprocedural laboratory examination: Secondary | ICD-10-CM | POA: Diagnosis present

## 2020-04-18 HISTORY — DX: Headache, unspecified: R51.9

## 2020-04-18 HISTORY — DX: Anemia, unspecified: D64.9

## 2020-04-18 LAB — CBC
HCT: 40.7 % (ref 36.0–46.0)
Hemoglobin: 13.1 g/dL (ref 12.0–15.0)
MCH: 29.1 pg (ref 26.0–34.0)
MCHC: 32.2 g/dL (ref 30.0–36.0)
MCV: 90.4 fL (ref 80.0–100.0)
Platelets: 313 10*3/uL (ref 150–400)
RBC: 4.5 MIL/uL (ref 3.87–5.11)
RDW: 13.3 % (ref 11.5–15.5)
WBC: 6.5 10*3/uL (ref 4.0–10.5)
nRBC: 0 % (ref 0.0–0.2)

## 2020-04-18 NOTE — Progress Notes (Signed)
COVID Vaccine Completed:No Date COVID Vaccine completed: COVID vaccine manufacturer: [place]   PCP - Carolee Rota NP Cardiologist - no  Chest x-ray - no EKG - no Stress Test - no ECHO - no Cardiac Cath -no Pacemaker/ICD device last checked:NA  Sleep Study - no CPAP -   Fasting Blood Sugar - NA Checks Blood Sugar _____ times a day  Blood Thinner Instructions:NA Aspirin Instructions: Last Dose:  Anesthesia review:   Patient denies shortness of breath, fever, cough and chest pain at PAT appointmentyes   Patient verbalized understanding of instructions that were given to them at the PAT appointment. Patient was also instructed that they will need to review over the PAT instructions again at home before surgery.  Yes  Pt has a double mastectomy and then a reconstruction with bilateral implants  02/29/20. She used to work out but now she only walks and feels that she gets SOB with activity but not ADLs

## 2020-04-18 NOTE — H&P (Signed)
Rita Ballard is a 43 y.o.  female, P: 4-0-0-4 who  presents for hysterectomy because of worsening menorrhagia, endometriosis and adenomyosis. Since the birth of ther third child in 2012 the patient's menstrual duration has been for 7 days with minimal cramping but increasing flow volume.  . Over the past year, in particular, she has continually soiled her clothes and linen in spite of wearing a tampon with an overnight sanitary pad. On occasion she may be able to wait and change her protection after 3 hours but most of the time more frequently. She goes on to admit to dyspareuina that is not improved with position,  but attributes this,  in part,  to her history of endometriosis. She denies any changes in her bowel or bladder function, vaginitis symptoms or non-period related pelvic pain. A pelvic ultrasound in March 2021 revealed an anteverted uterus (volume-125 mL) 10.7 cm from fundus to external os having the appearance of adenomyosis; measures: 7.04 x 5.69 x 5.96 cm, endometrium: 8.69 mm; right ovary-3.16 cm and left ovary-3.30 cm. An endometrial biopsy performed at her pre-op exam returned: proliferative endometrium with no hyperplasia or carcinoma. Earlier this year the patient was diagnosed with left sided breast cancer and underwent a bilateral mastectomy. Having this diagnosis limits the medical options for managing her symptoms and since she has completed her desire to bear children, the patient has chosen definitive therapy in the form of hysterectomy.   Past Medical History  OB History: G:4;  P: 4-0-0-4;  C-section 2003;  SVB: 2006, 2012 and 2016  (largest infant: 8 lbs  14 oz)  GYN History: menarche: 44 YO;    LMP: 03/19/2020; Contraception: Condoms;  Denies history of abnormal PAP smear. 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     Physical Exam  Bp: 110/68;  P: 70 bpm;  R: 16;  Weight: 178 lbs.;  Height: 5\' 8"   BMI: 27.1;  O2Sat. 99% (room air)  Neck: supple without masses or thyromegaly Lungs: clear to auscultation Heart: regular rate and rhythm Abdomen: soft, non-tender and no organomegaly Pelvic:EGBUS- wnl; vagina-normal rugae with moderate blood; uterus-upper limits of normal size, cervix without lesions or motion tenderness; adnexae-bilateral tenderness but no  masses Extremities:  no clubbing, cyanosis or edema   Assesment: Menorrhagia                      Endometriosis                      Adenomyosis                      Breast Cancer   Disposition:   Robot-assisted  hysterectomy was reviewed with the patient.   Benefits of the robotic approach include lesser postoperative pain, less blood loss during surgery, reduced risk of injury to other organs due to better visualization with a 3-D HD 10 times magnifying camera, shorter hospital stay between 0-1 night and rapid recovery with return to daily routine in 2-3 weeks. Although robotic-assisted hysterectomy has a longer operative time than traditional laparotomy, in a patient with good medical history, the  benefits usually outweigh the risks.  Risks include reaction to anesthesia, bleeding, infection, injury to other organs, need for laparotomy, transient post-operative facial edema, increased risk of pelvic prolapse associated with any hysterectomy as well as earlier onset of menopause. Preservation or preventative removal of the ovaries was also reviewed and left to the patient's discretion. Finally, the option of supracervical hysterectomy was also discussed with the possible but yet unconfirmed benefits of reduction of pelvic prolapse. If supracervical hysterectomy is performed, Pap smear screening would continue as currently recommended, monthly bleeding is possible despite cauterization of cervical canal and a small but possible risk of cervical fibroid development is present.   The patient verbalized understanding of these risks, was given a Miralax bowel prep to complete the day before her surgery and she has consented to proceed with a Robot Assisted Total Laparoscopic Hysterectomy with Bilateral Salpingectomy at [facility] on April 25, 2020 @ 12:15 p.m.    CSN# [id number]   Rourke Mcquitty J. Florene Glen, PA-C  for Dr. Dede Query. Rivard

## 2020-04-22 ENCOUNTER — Other Ambulatory Visit (HOSPITAL_COMMUNITY)
Admission: RE | Admit: 2020-04-22 | Discharge: 2020-04-22 | Disposition: A | Discharge status: Home / Self Care | Payer: 59 | Source: Ambulatory Visit | Attending: Obstetrics and Gynecology | Admitting: Obstetrics and Gynecology

## 2020-04-22 DIAGNOSIS — Z20822 Contact with and (suspected) exposure to covid-19: Secondary | ICD-10-CM | POA: Insufficient documentation

## 2020-04-22 DIAGNOSIS — Z01812 Encounter for preprocedural laboratory examination: Secondary | ICD-10-CM | POA: Diagnosis present

## 2020-04-22 LAB — SARS CORONAVIRUS 2 (TAT 6-24 HRS): SARS Coronavirus 2: NEGATIVE

## 2020-04-23 ENCOUNTER — Encounter: Payer: Self-pay | Admitting: Plastic Surgery

## 2020-04-23 ENCOUNTER — Other Ambulatory Visit: Payer: Self-pay

## 2020-04-23 ENCOUNTER — Ambulatory Visit (INDEPENDENT_AMBULATORY_CARE_PROVIDER_SITE_OTHER): Payer: 59 | Admitting: Plastic Surgery

## 2020-04-23 VITALS — BP 117/72 | HR 68 | Temp 98.2°F

## 2020-04-23 DIAGNOSIS — Z9013 Acquired absence of bilateral breasts and nipples: Secondary | ICD-10-CM

## 2020-04-23 NOTE — Progress Notes (Signed)
   Subjective:    Patient ID: Rita Ballard, female    DOB: 1976-03-19, 44 y.o.   MRN: [id number]  The patient is a 44 year old female here for postop follow-up after bilateral breast reconstruction.  Her surgery was in August.  The incisions are healing nicely.  There is no sign of seroma or hematoma.  The implants appear to be in good position and soft.  Rita Ballard is planning on having a hysterectomy on Thursday.  Rita Ballard also complains of low energy.  Her lab work looks good and her hemoglobin is within normal limits.  Rita Ballard is willing to reach out to her primary care doctor to discuss the fatigue.  Rita Ballard is not back to work yet.     Review of Systems  Constitutional: Positive for activity change. Negative for appetite change.  HENT: Negative.   Eyes: Negative.   Respiratory: Negative.   Cardiovascular: Negative.   Genitourinary: Negative.   Musculoskeletal: Negative.   Hematological: Negative.   Psychiatric/Behavioral: Negative.        Objective:   Physical Exam Vitals and nursing note reviewed.  Constitutional:      Appearance: Normal appearance.  HENT:     Head: Normocephalic and atraumatic.  Cardiovascular:     Rate and Rhythm: Normal rate.     Pulses: Normal pulses.  Pulmonary:     Effort: Pulmonary effort is normal.  Neurological:     General: No focal deficit present.     Mental Status: Rita Ballard is alert. Mental status is at baseline.  Psychiatric:        Mood and Affect: Mood normal.        Behavior: Behavior normal.        Assessment & Plan:     ICD-10-CM   1. Acquired absence of breast and absent nipple, bilateral  Z90.13     Continue sports bra and light massage.  Patient not interested in tattoo of nipple areola at this time.  I would like to see her back in 6 weeks.  Rita Ballard may be ready for fat filling of the upper medial pole of both breasts.  Pictures were obtained of the patient and placed in the chart with the patient's or guardian's permission.

## 2020-04-25 ENCOUNTER — Other Ambulatory Visit: Payer: Self-pay

## 2020-04-25 ENCOUNTER — Encounter (HOSPITAL_COMMUNITY): Payer: Self-pay | Admitting: Obstetrics and Gynecology

## 2020-04-25 ENCOUNTER — Ambulatory Visit (HOSPITAL_COMMUNITY)
Admission: RE | Admit: 2020-04-25 | Discharge: 2020-04-25 | Disposition: A | Discharge status: Home / Self Care | Payer: 59 | Attending: Obstetrics and Gynecology | Admitting: Obstetrics and Gynecology

## 2020-04-25 ENCOUNTER — Ambulatory Visit (HOSPITAL_COMMUNITY): Payer: 59 | Admitting: Anesthesiology

## 2020-04-25 ENCOUNTER — Encounter (HOSPITAL_COMMUNITY)
Admission: RE | Disposition: A | Discharge status: Home / Self Care | Payer: Self-pay | Source: Home / Self Care | Attending: Obstetrics and Gynecology

## 2020-04-25 DIAGNOSIS — N809 Endometriosis, unspecified: Secondary | ICD-10-CM

## 2020-04-25 DIAGNOSIS — N800 Endometriosis of uterus: Secondary | ICD-10-CM

## 2020-04-25 DIAGNOSIS — N920 Excessive and frequent menstruation with regular cycle: Secondary | ICD-10-CM | POA: Diagnosis not present

## 2020-04-25 DIAGNOSIS — Z9882 Breast implant status: Secondary | ICD-10-CM | POA: Insufficient documentation

## 2020-04-25 DIAGNOSIS — Z9013 Acquired absence of bilateral breasts and nipples: Secondary | ICD-10-CM | POA: Diagnosis not present

## 2020-04-25 DIAGNOSIS — D259 Leiomyoma of uterus, unspecified: Secondary | ICD-10-CM | POA: Insufficient documentation

## 2020-04-25 DIAGNOSIS — Z86000 Personal history of in-situ neoplasm of breast: Secondary | ICD-10-CM | POA: Insufficient documentation

## 2020-04-25 DIAGNOSIS — N946 Dysmenorrhea, unspecified: Secondary | ICD-10-CM

## 2020-04-25 DIAGNOSIS — N8003 Adenomyosis of the uterus: Secondary | ICD-10-CM

## 2020-04-25 HISTORY — PX: ROBOTIC ASSISTED LAPAROSCOPIC HYSTERECTOMY AND SALPINGECTOMY: SHX6379

## 2020-04-25 LAB — BASIC METABOLIC PANEL
Anion gap: 11 (ref 5–15)
BUN: 10 mg/dL (ref 6–20)
CO2: 20 mmol/L — ABNORMAL LOW (ref 22–32)
Calcium: 9.1 mg/dL (ref 8.9–10.3)
Chloride: 105 mmol/L (ref 98–111)
Creatinine, Ser: 1.02 mg/dL — ABNORMAL HIGH (ref 0.44–1.00)
GFR, Estimated: >60 mL/min (ref >60)
Glucose, Bld: 84 mg/dL (ref 70–99)
Potassium: 4.3 mmol/L (ref 3.5–5.1)
Sodium: 136 mmol/L (ref 135–145)

## 2020-04-25 LAB — TYPE AND SCREEN
ABO/RH(D): O POS
Antibody Screen: NEGATIVE

## 2020-04-25 LAB — PREGNANCY, URINE: Preg Test, Ur: NEGATIVE

## 2020-04-25 SURGERY — XI ROBOTIC ASSISTED LAPAROSCOPIC HYSTERECTOMY AND SALPINGECTOMY
Anesthesia: General | Laterality: Bilateral

## 2020-04-25 MED ORDER — DIPHENHYDRAMINE HCL 50 MG/ML IJ SOLN
INTRAMUSCULAR | Status: AC
  Filled 2020-04-25: qty 1

## 2020-04-25 MED ORDER — FENTANYL CITRATE (PF) 100 MCG/2ML IJ SOLN
INTRAMUSCULAR | Status: AC
  Filled 2020-04-25: qty 2

## 2020-04-25 MED ORDER — EPHEDRINE SULFATE 50 MG/ML IJ SOLN
INTRAMUSCULAR | Status: DC | PRN
  Administered 2020-04-25: 5 mg via INTRAVENOUS

## 2020-04-25 MED ORDER — ALBUMIN HUMAN 5 % IV SOLN
INTRAVENOUS | Status: AC
  Filled 2020-04-25: qty 250

## 2020-04-25 MED ORDER — MIDAZOLAM HCL 5 MG/5ML IJ SOLN
INTRAMUSCULAR | Status: DC | PRN
  Administered 2020-04-25 (×2): 1 mg via INTRAVENOUS

## 2020-04-25 MED ORDER — OXYCODONE HCL 5 MG PO TABS
5.0000 mg | ORAL_TABLET | Freq: Once | ORAL | Status: AC | PRN
  Administered 2020-04-25: 5 mg via ORAL

## 2020-04-25 MED ORDER — LIDOCAINE HCL (PF) 2 % IJ SOLN
INTRAMUSCULAR | Status: DC | PRN
  Administered 2020-04-25: 1 mg/kg/h via INTRADERMAL

## 2020-04-25 MED ORDER — FENTANYL CITRATE (PF) 250 MCG/5ML IJ SOLN
INTRAMUSCULAR | Status: AC
  Filled 2020-04-25: qty 5

## 2020-04-25 MED ORDER — FENTANYL CITRATE (PF) 100 MCG/2ML IJ SOLN
25.0000 ug | INTRAMUSCULAR | Status: DC | PRN
  Administered 2020-04-25 (×2): 25 ug via INTRAVENOUS

## 2020-04-25 MED ORDER — CELECOXIB 200 MG PO CAPS
400.0000 mg | ORAL_CAPSULE | ORAL | Status: AC
  Administered 2020-04-25: 400 mg via ORAL
  Filled 2020-04-25: qty 2

## 2020-04-25 MED ORDER — ENSURE PRE-SURGERY PO LIQD
296.0000 mL | Freq: Once | ORAL | Status: DC
  Filled 2020-04-25: qty 296

## 2020-04-25 MED ORDER — STERILE WATER FOR IRRIGATION IR SOLN
Status: DC | PRN
  Administered 2020-04-25: 1000 mL

## 2020-04-25 MED ORDER — CEFAZOLIN SODIUM-DEXTROSE 2-4 GM/100ML-% IV SOLN
2.0000 g | INTRAVENOUS | Status: AC
  Administered 2020-04-25: 2 g via INTRAVENOUS
  Filled 2020-04-25: qty 100

## 2020-04-25 MED ORDER — GLYCOPYRROLATE PF 0.2 MG/ML IJ SOSY
PREFILLED_SYRINGE | INTRAMUSCULAR | Status: AC
  Filled 2020-04-25: qty 1

## 2020-04-25 MED ORDER — GABAPENTIN 300 MG PO CAPS
300.0000 mg | ORAL_CAPSULE | ORAL | Status: AC
  Administered 2020-04-25: 300 mg via ORAL
  Filled 2020-04-25: qty 1

## 2020-04-25 MED ORDER — LACTATED RINGERS IV SOLN: INTRAVENOUS | Status: DC

## 2020-04-25 MED ORDER — ACETAMINOPHEN 500 MG PO TABS: 1000.0000 mg | ORAL_TABLET | Freq: Four times a day (QID) | ORAL | Status: DC

## 2020-04-25 MED ORDER — MEPERIDINE HCL 50 MG/ML IJ SOLN: 6.2500 mg | INTRAMUSCULAR | Status: DC | PRN

## 2020-04-25 MED ORDER — ROCURONIUM BROMIDE 100 MG/10ML IV SOLN
INTRAVENOUS | Status: DC | PRN
  Administered 2020-04-25: 10 mg via INTRAVENOUS
  Administered 2020-04-25: 20 mg via INTRAVENOUS
  Administered 2020-04-25: 30 mg via INTRAVENOUS
  Administered 2020-04-25: 70 mg via INTRAVENOUS

## 2020-04-25 MED ORDER — DEXAMETHASONE SODIUM PHOSPHATE 10 MG/ML IJ SOLN
INTRAMUSCULAR | Status: AC
  Filled 2020-04-25: qty 1

## 2020-04-25 MED ORDER — SUGAMMADEX SODIUM 200 MG/2ML IV SOLN
INTRAVENOUS | Status: DC | PRN
  Administered 2020-04-25: 160 mg via INTRAVENOUS

## 2020-04-25 MED ORDER — MIDAZOLAM HCL 2 MG/2ML IJ SOLN
INTRAMUSCULAR | Status: AC
  Filled 2020-04-25: qty 2

## 2020-04-25 MED ORDER — LACTATED RINGERS IV SOLN: INTRAVENOUS | Status: DC | PRN

## 2020-04-25 MED ORDER — KETOROLAC TROMETHAMINE 30 MG/ML IJ SOLN: 30.0000 mg | Freq: Once | INTRAMUSCULAR | Status: DC

## 2020-04-25 MED ORDER — ONDANSETRON HCL 4 MG/2ML IJ SOLN
INTRAMUSCULAR | Status: AC
  Filled 2020-04-25: qty 2

## 2020-04-25 MED ORDER — IBUPROFEN 600 MG PO TABS: ORAL_TABLET | ORAL | 1 refills | Status: DC

## 2020-04-25 MED ORDER — LACTATED RINGERS IR SOLN
Status: DC | PRN
  Administered 2020-04-25: 1

## 2020-04-25 MED ORDER — ONDANSETRON HCL 4 MG/2ML IJ SOLN
INTRAMUSCULAR | Status: DC | PRN
  Administered 2020-04-25 (×2): 4 mg via INTRAVENOUS

## 2020-04-25 MED ORDER — SODIUM CHLORIDE 0.9 % IR SOLN
Status: DC | PRN
  Administered 2020-04-25: 3000 mL via INTRAVESICAL

## 2020-04-25 MED ORDER — LIDOCAINE HCL (CARDIAC) PF 100 MG/5ML IV SOSY
PREFILLED_SYRINGE | INTRAVENOUS | Status: DC | PRN
  Administered 2020-04-25: 50 mg via INTRAVENOUS

## 2020-04-25 MED ORDER — ENSURE PRE-SURGERY PO LIQD
592.0000 mL | Freq: Once | ORAL | Status: DC
  Filled 2020-04-25: qty 592

## 2020-04-25 MED ORDER — ACETAMINOPHEN 500 MG PO TABS
1000.0000 mg | ORAL_TABLET | ORAL | Status: AC
  Administered 2020-04-25: 1000 mg via ORAL
  Filled 2020-04-25: qty 2

## 2020-04-25 MED ORDER — ACETAMINOPHEN 325 MG PO TABS
325.0000 mg | ORAL_TABLET | ORAL | Status: DC | PRN
  Administered 2020-04-25: 650 mg via ORAL

## 2020-04-25 MED ORDER — DEXAMETHASONE SODIUM PHOSPHATE 10 MG/ML IJ SOLN
INTRAMUSCULAR | Status: DC | PRN
  Administered 2020-04-25: 10 mg via INTRAVENOUS

## 2020-04-25 MED ORDER — FENTANYL CITRATE (PF) 100 MCG/2ML IJ SOLN
INTRAMUSCULAR | Status: DC | PRN
Start: 2020-04-25 — End: 2020-04-25
  Administered 2020-04-25: 50 ug via INTRAVENOUS
  Administered 2020-04-25: 12.5 ug via INTRAVENOUS
  Administered 2020-04-25 (×4): 50 ug via INTRAVENOUS

## 2020-04-25 MED ORDER — ACETAMINOPHEN 325 MG PO TABS
ORAL_TABLET | ORAL | Status: AC
  Filled 2020-04-25: qty 2

## 2020-04-25 MED ORDER — ROCURONIUM BROMIDE 10 MG/ML (PF) SYRINGE
PREFILLED_SYRINGE | INTRAVENOUS | Status: AC
  Filled 2020-04-25: qty 10

## 2020-04-25 MED ORDER — ORAL CARE MOUTH RINSE: 15.0000 mL | Freq: Once | OROMUCOSAL | Status: AC

## 2020-04-25 MED ORDER — PROPOFOL 10 MG/ML IV BOLUS
INTRAVENOUS | Status: AC
  Filled 2020-04-25: qty 20

## 2020-04-25 MED ORDER — ONDANSETRON HCL 4 MG/2ML IJ SOLN: 4.0000 mg | Freq: Once | INTRAMUSCULAR | Status: DC | PRN

## 2020-04-25 MED ORDER — GLYCOPYRROLATE 0.2 MG/ML IJ SOLN
INTRAMUSCULAR | Status: DC | PRN
  Administered 2020-04-25: .2 mg via INTRAVENOUS

## 2020-04-25 MED ORDER — ALBUMIN HUMAN 5 % IV SOLN: INTRAVENOUS | Status: DC | PRN

## 2020-04-25 MED ORDER — ACETAMINOPHEN 160 MG/5ML PO SOLN: 325.0000 mg | ORAL | Status: DC | PRN

## 2020-04-25 MED ORDER — SODIUM CHLORIDE (PF) 0.9 % IJ SOLN
INTRAMUSCULAR | Status: AC
  Filled 2020-04-25: qty 50

## 2020-04-25 MED ORDER — SODIUM CHLORIDE (PF) 0.9 % IJ SOLN
INTRAMUSCULAR | Status: AC
  Filled 2020-04-25: qty 10

## 2020-04-25 MED ORDER — ACETAMINOPHEN 500 MG PO TABS: ORAL_TABLET | ORAL | 1 refills | Status: DC

## 2020-04-25 MED ORDER — OXYCODONE HCL 5 MG/5ML PO SOLN: 5.0000 mg | Freq: Once | ORAL | Status: AC | PRN

## 2020-04-25 MED ORDER — EPHEDRINE 5 MG/ML INJ
INTRAVENOUS | Status: AC
  Filled 2020-04-25: qty 10

## 2020-04-25 MED ORDER — DIPHENHYDRAMINE HCL 50 MG/ML IJ SOLN
INTRAMUSCULAR | Status: DC | PRN
  Administered 2020-04-25: 12.5 mg via INTRAVENOUS

## 2020-04-25 MED ORDER — PROPOFOL 10 MG/ML IV BOLUS
INTRAVENOUS | Status: DC | PRN
  Administered 2020-04-25: 170 mg via INTRAVENOUS

## 2020-04-25 MED ORDER — SCOPOLAMINE 1 MG/3DAYS TD PT72
1.0000 | MEDICATED_PATCH | TRANSDERMAL | Status: DC
  Administered 2020-04-25: 1.5 mg via TRANSDERMAL
  Filled 2020-04-25: qty 1

## 2020-04-25 MED ORDER — KETAMINE HCL 10 MG/ML IJ SOLN
INTRAMUSCULAR | Status: AC
  Filled 2020-04-25: qty 1

## 2020-04-25 MED ORDER — OXYCODONE HCL 5 MG PO TABS
ORAL_TABLET | ORAL | Status: AC
  Filled 2020-04-25: qty 1

## 2020-04-25 MED ORDER — POVIDONE-IODINE 10 % EX SWAB
2.0000 "application " | Freq: Once | CUTANEOUS | Status: AC
  Administered 2020-04-25: 2 via TOPICAL

## 2020-04-25 MED ORDER — OXYCODONE HCL 5 MG PO TABS
ORAL_TABLET | ORAL | 0 refills | Status: DC
Start: 2020-04-25 — End: 2020-10-24

## 2020-04-25 MED ORDER — LIDOCAINE 2% (20 MG/ML) 5 ML SYRINGE
INTRAMUSCULAR | Status: AC
  Filled 2020-04-25: qty 5

## 2020-04-25 MED ORDER — KETAMINE HCL 10 MG/ML IJ SOLN
INTRAMUSCULAR | Status: DC | PRN
  Administered 2020-04-25: 35 mg via INTRAVENOUS

## 2020-04-25 MED ORDER — SODIUM CHLORIDE 0.9 % IV SOLN
INTRAVENOUS | Status: DC | PRN
  Administered 2020-04-25: 110 mL

## 2020-04-25 MED ORDER — CHLORHEXIDINE GLUCONATE 0.12 % MT SOLN
15.0000 mL | Freq: Once | OROMUCOSAL | Status: AC
  Administered 2020-04-25: 15 mL via OROMUCOSAL

## 2020-04-25 MED ORDER — ROPIVACAINE HCL 5 MG/ML IJ SOLN
INTRAMUSCULAR | Status: AC
  Filled 2020-04-25: qty 60

## 2020-04-25 SURGICAL SUPPLY — 63 items
BARRIER ADHS 3X4 INTERCEED (GAUZE/BANDAGES/DRESSINGS) ×2 IMPLANT
BLADE LAP MORCELLATOR 15X9.5 (ELECTROSURGICAL) IMPLANT
BLADE MORCELLATOR EXT  12.5X15 (ELECTROSURGICAL)
BLADE MORCELLATOR EXT 12.5X15 (ELECTROSURGICAL) IMPLANT
CANISTER SUCT 3000ML PPV (MISCELLANEOUS) ×2 IMPLANT
COVER BACK TABLE 60X90IN (DRAPES) ×2 IMPLANT
COVER TIP SHEARS 8 DVNC (MISCELLANEOUS) ×1 IMPLANT
COVER TIP SHEARS 8MM DA VINCI (MISCELLANEOUS) ×2
COVER WAND RF STERILE (DRAPES) ×2 IMPLANT
DECANTER SPIKE VIAL GLASS SM (MISCELLANEOUS) ×4 IMPLANT
DEFOGGER SCOPE WARMER CLEARIFY (MISCELLANEOUS) ×2 IMPLANT
DERMABOND ADVANCED (GAUZE/BANDAGES/DRESSINGS) ×1
DERMABOND ADVANCED .7 DNX12 (GAUZE/BANDAGES/DRESSINGS) ×1 IMPLANT
DILATOR CANAL MILEX (MISCELLANEOUS) IMPLANT
DRAPE ARM DVNC X/XI (DISPOSABLE) ×4 IMPLANT
DRAPE COLUMN DVNC XI (DISPOSABLE) ×1 IMPLANT
DRAPE DA VINCI XI ARM (DISPOSABLE) ×8
DRAPE DA VINCI XI COLUMN (DISPOSABLE) ×2
DURAPREP 26ML APPLICATOR (WOUND CARE) ×2 IMPLANT
ELECT REM PT RETURN 15FT ADLT (MISCELLANEOUS) ×2 IMPLANT
GAUZE 4X4 16PLY RFD (DISPOSABLE) ×2 IMPLANT
GLOVE BIO SURGEON STRL SZ7 (GLOVE) ×2 IMPLANT
GLOVE BIOGEL PI IND STRL 7.0 (GLOVE) ×5 IMPLANT
GLOVE BIOGEL PI INDICATOR 7.0 (GLOVE) ×5
GLOVE ECLIPSE 6.5 STRL STRAW (GLOVE) ×6 IMPLANT
IRRIG SUCT STRYKERFLOW 2 WTIP (MISCELLANEOUS) ×2
IRRIGATION SUCT STRKRFLW 2 WTP (MISCELLANEOUS) ×1 IMPLANT
LEGGING LITHOTOMY PAIR STRL (DRAPES) ×2 IMPLANT
OBTURATOR OPTICAL STANDARD 8MM (TROCAR) ×2
OBTURATOR OPTICAL STND 8 DVNC (TROCAR) ×1
OBTURATOR OPTICALSTD 8 DVNC (TROCAR) ×1 IMPLANT
OCCLUDER COLPOPNEUMO (BALLOONS) ×2 IMPLANT
PACK ROBOT WH (CUSTOM PROCEDURE TRAY) ×2 IMPLANT
PACK ROBOTIC GOWN (GOWN DISPOSABLE) ×2 IMPLANT
PACK TRENDGUARD 450 HYBRID PRO (MISCELLANEOUS) ×1 IMPLANT
PAD PREP 24X48 CUFFED NSTRL (MISCELLANEOUS) ×2 IMPLANT
POUCH LAPAROSCOPIC INSTRUMENT (MISCELLANEOUS) ×2 IMPLANT
PROTECTOR NERVE ULNAR (MISCELLANEOUS) ×6 IMPLANT
SEAL CANN UNIV 5-8 DVNC XI (MISCELLANEOUS) ×3 IMPLANT
SEAL XI 5MM-8MM UNIVERSAL (MISCELLANEOUS) ×6
SEALER VESSEL DA VINCI XI (MISCELLANEOUS)
SEALER VESSEL EXT DVNC XI (MISCELLANEOUS) IMPLANT
SET TRI-LUMEN FLTR TB AIRSEAL (TUBING) ×2 IMPLANT
STRIP CLOSURE SKIN 1/2X4 (GAUZE/BANDAGES/DRESSINGS) ×2 IMPLANT
STRIP CLOSURE SKIN 1/4X4 (GAUZE/BANDAGES/DRESSINGS) ×2 IMPLANT
SUT MNCRL AB 3-0 PS2 18 (SUTURE) ×4 IMPLANT
SUT MNCRL AB 3-0 PS2 27 (SUTURE) ×4 IMPLANT
SUT VIC AB 0 CT1 27 (SUTURE) ×4
SUT VIC AB 0 CT1 27XBRD ANBCTR (SUTURE) ×2 IMPLANT
SUT VICRYL 0 UR6 27IN ABS (SUTURE) ×6 IMPLANT
SUT VLOC 180 0 9IN  GS21 (SUTURE) ×4
SUT VLOC 180 0 9IN GS21 (SUTURE) ×2 IMPLANT
TIP RUMI ORANGE 6.7MMX12CM (TIP) IMPLANT
TIP UTERINE 5.1X6CM LAV DISP (MISCELLANEOUS) IMPLANT
TIP UTERINE 6.7X10CM GRN DISP (MISCELLANEOUS) ×2 IMPLANT
TIP UTERINE 6.7X6CM WHT DISP (MISCELLANEOUS) IMPLANT
TIP UTERINE 6.7X8CM BLUE DISP (MISCELLANEOUS) IMPLANT
TOWEL OR 17X26 10 PK STRL BLUE (TOWEL DISPOSABLE) ×2 IMPLANT
TRAY FOL W/BAG SLVR 16FR STRL (SET/KITS/TRAYS/PACK) ×1 IMPLANT
TRAY FOLEY W/BAG SLVR 16FR LF (SET/KITS/TRAYS/PACK) ×2
TRENDGUARD 450 HYBRID PRO PACK (MISCELLANEOUS) ×2
TROCAR PORT AIRSEAL 8X120 (TROCAR) ×2 IMPLANT
WATER STERILE IRR 1000ML POUR (IV SOLUTION) ×2 IMPLANT

## 2020-04-25 NOTE — Anesthesia Postprocedure Evaluation (Signed)
Anesthesia Post Note  Patient: Rita Ballard  Procedure(s) Performed: XI ROBOTIC ASSISTED LAPAROSCOPIC HYSTERECTOMY AND SALPINGECTOMY (Bilateral )     Patient location during evaluation: PACU Anesthesia Type: General Level of consciousness: awake and alert Pain management: pain level controlled Vital Signs Assessment: post-procedure vital signs reviewed and stable Respiratory status: spontaneous breathing, nonlabored ventilation, respiratory function stable and patient connected to nasal cannula oxygen Cardiovascular status: blood pressure returned to baseline and stable Postop Assessment: no apparent nausea or vomiting Anesthetic complications: no   No complications documented.  Last Vitals:  Vitals:   04/25/20 1645 04/25/20 1700  BP: 125/73 118/75  Pulse: (!) 57 64  Resp: 16 16  Temp:  36.7 C  SpO2: 99% 97%    Last Pain:  Vitals:   04/25/20 1700  TempSrc:   PainSc: 4                  Bralin Garry

## 2020-04-25 NOTE — Op Note (Signed)
Preoperative diagnosis: Uterine adenomyosis  Postoperative diagnosis: Same   Anesthesia: General  Anesthesiologist: Dr. Eligha Bridegroom  Procedure: Robotically assisted total hysterectomy with bilateral salpingectomy  Surgeon: Dr. Katharine Look Glendola Friedhoff  Assistant: Earnstine Regal P.A.-C.  Estimated blood loss: 200 cc  Procedure:  After being informed of the planned procedure with possible complications including but not limited to bleeding, infection, injury to other organs, need for laparotomy, possible need for morcellation with risks and benefits reviewed, expected hospital stay and recovery, informed consent is obtained and patient is taken to or #6. She is placed in  lithotomy position on [place] with both arms padded and tucked on each side and bilateral knee-high sequential compressive devices. She is given general anesthesia with endotracheal intubation without any complication. She is prepped and draped in a sterile fashion. A three-way Foley catheter is inserted in her bladder.  Pelvic exam reveals: anteverted uterus and 2 normal adnexa  A weighted speculum is inserted in the vagina and the anterior lip of the cervix is grasped with a tenaculum forcep. We proceed with a paracervical block and vaginal infiltration using ropivacaine 0.5% diluted 1 in 1 with saline. The uterus was then sounded at 11 cm. We easily dilate the cervix using Hegar dilator to  #27 which allows for easy placement of the intrauterine RUMI manipulator with a 4.0 KOH ring and a vaginal occluder. The ring is sutured to the cervix with 0 Vicryl.  Trocar placement is decided. We infiltrate at the umbilicus with 10 cc of ropivacaine per protocol and perform a 10 mm semi-elliptical incision which is brought down bluntly to the fascia. The fascia is identified and grasped with Coker forceps. The fascia is incised with Mayo scissors. Peritoneum is entered bluntly. A pursestring suture of 0 Vicryl is placed on the fascia and a 10 mm  Hassan trocar is easily inserted in the abdominal cavity held in placed with a Purstring suture. This allows for easy insufflation of a pneumoperitoneum using warmed CO2 at a maximum pressure of 15 mm of mercury. 60 cc of Ropivacaine 0.5 % diluted 1 in 1 is sent in the pelvis and the patient is positioned in reverse Trendelenburg. We then placed one 84mm robotic trocar on the left, one 75mm robotic trocar on the right and one 8 mm patient's side assistant trocar on the right  after infiltrating every site  with ropivacaine per protocol. The robot is docked on the right of the patient after positioning her in Trendelenburg. A Vessel Seal is inserted in arm #4 and a Long bipolar forceps is inserted in arm #2.  Preparation and docking is completed in 63 minutes.  Observation: anterior and posterior cul-de-sac are normal, uterus, tubes and ovaries are normal. Gallbladder and liver are normal. Appendix is not seen  We start on the right side by sealing and cutting the mesosalpynx , the right utero-ovarian ligament and the right round ligament .  This gives [place] entry into the retroperitoneal space with an easy dissection of the anterior broad ligament. The ligament was opened all the way to the left round ligament.   We then proceed with systematic dissection of the bladder from the anterior vaginal cuff which is easily identified with the KOH ring. The plane of dissection is confirmed with filling the bladder with 200 cc of saline. We are able to dissect the bladder 2 cm below the KOH ring. We then opened the posterior right broad ligament all the way to the posterior KOH ring after identifying the full  course of the right ureter.   Moving to the left side we Seal and cut  the left round ligament , the left utero-ovarian ligament and  the mesosalpinx in between. Entry into the retroperitoneal space allows [place] to complete dissection of the bladder on the left side and skeletonized the uterine vessels. The left broad  ligament is then dissected all the way to the posterior KOH ring after identifying the full course of the left ureter.  Both tubes are removed from the pelvic cavity through the assistant's trocar.   With pressure on the KOH ring and the bladder fully dissected, we cauterize and cut the uterine vessels on both sides at the level of the KOH ring.  The vaginal occluder is inflated and we proceed with a 360 colpotomy using an open monopolar scissors and freeing the uterus entirely.  The uterus is delivered vaginally with traction only. The vaginal occluder is reinserted in the vagina to maintain pneumoperitoneum.  Instruments are then modified for a suture cut in arm #4 and a long tip forcep in arm #2. We proceed with closure of the vaginal cuff with 2 running sutures of 0 V-Lock. We irrigated profusely with warm saline and confirm a satisfactory hemostasis as well as 2 normal ureters with good mobility and no dilatation.  A sheet of Interceed , divided in 2, is placed on the vaginal cuff and behind the ovaries.  All instruments are then removed and the robot is undocked. Console time: 1 hour and 19 minutes.  All trochars are removed under direct visualization after evacuating the pneumoperitoneum.  The fascia of the supraumbilical incision is closed with the previously placed pursestring suture of 0 Vicryl. All incisions are then closed with subcuticular suture of 3-0 Monocryl and Dermabond.  A speculum is inserted in the vagina.We note light bleeding from the left angle of the vaginal cuff. A figure-of-eight suture of 0-Vicryl is placed. Hemostasis is adequate.  Instrument and sponge count is complete x2. The procedure is well tolerated by the patient is taken to recovery room in a well and stable condition.  Estimated blood loss: 200  Specimen: Uterus and tubes weighing 223 g sent to pathology

## 2020-04-25 NOTE — Transfer of Care (Signed)
Immediate Anesthesia Transfer of Care Note  Patient: Rita Ballard  Procedure(s) Performed: XI ROBOTIC ASSISTED LAPAROSCOPIC HYSTERECTOMY AND SALPINGECTOMY (Bilateral )  Patient Location: PACU  Anesthesia Type:General  Level of Consciousness: awake, alert  and oriented  Airway & Oxygen Therapy: Patient Spontanous Breathing and Patient connected to face mask oxygen  Post-op Assessment: Report given to RN, Post -op Vital signs reviewed and stable and Patient moving all extremities X 4  Post vital signs: Reviewed and stable  Last Vitals:  Vitals Value Taken Time  BP 129/78 04/25/20 1603  Temp    Pulse 78 04/25/20 1604  Resp 18 04/25/20 1604  SpO2 100 % 04/25/20 1604  Vitals shown include unvalidated device data.  Last Pain:  Vitals:   04/25/20 1121  TempSrc: Oral  PainSc:          Complications: No complications documented.

## 2020-04-25 NOTE — Discharge Instructions (Signed)
Call [facility] @ [phone] if:  You have a temperature greater than or equal to 100.4 degrees Farenheit orally You have pain that is not made better by the pain medication given and taken as directed You have excessive bleeding or problems urinating  Take Colace (Docusate Sodium/Stool Softener) 100 mg 2-3 times daily while taking narcotic pain medicine to avoid constipation or until bowel movements are regular. Take Ibuprofen 600 mg and Tylenol   (Acetaminophen)  #2- 500 mg  tablets,  with food,   every 6 hours for the next 5 days then as needed for pain.  First dose 11 pm day of surgery  You may drive after 2  weeks You may walk up steps  You may shower tomorrow You may resume a regular diet  Keep incisions clean and dry Do not lift over 15 pounds for 6 weeks Avoid anything in vagina for 6 weeks

## 2020-04-25 NOTE — Anesthesia Preprocedure Evaluation (Addendum)
Anesthesia Evaluation  Patient identified by MRN, date of birth, ID band Patient awake    Reviewed: Allergy & Precautions, NPO status , Patient's Chart, lab work & pertinent test results  History of Anesthesia Complications Negative for: history of anesthetic complications  Airway Mallampati: I  TM Distance: >3 FB Neck ROM: Full    Dental  (+) Teeth Intact, Dental Advisory Given   Pulmonary neg pulmonary ROS,    breath sounds clear to auscultation       Cardiovascular negative cardio ROS   Rhythm:Regular Rate:Normal     Neuro/Psych  Headaches, negative psych ROS   GI/Hepatic negative GI ROS, Neg liver ROS,   Endo/Other  negative endocrine ROS  Renal/GU negative Renal ROS     Musculoskeletal negative musculoskeletal ROS (+)   Abdominal   Peds  Hematology  (+) anemia ,   Anesthesia Other Findings Left breast CA  Reproductive/Obstetrics                            Anesthesia Physical  Anesthesia Plan  ASA: II  Anesthesia Plan: General   Post-op Pain Management:    Induction:   PONV Risk Score and Plan: 3 and Ondansetron and Dexamethasone  Airway Management Planned: LMA  Additional Equipment: None  Intra-op Plan:   Post-operative Plan: Extubation in OR  Informed Consent: I have reviewed the patients History and Physical, chart, labs and discussed the procedure including the risks, benefits and alternatives for the proposed anesthesia with the patient or authorized representative who has indicated his/her understanding and acceptance.     Dental advisory given  Plan Discussed with: CRNA, Surgeon and Anesthesiologist  Anesthesia Plan Comments:         Anesthesia Quick Evaluation

## 2020-04-25 NOTE — Interval H&P Note (Signed)
History and Physical Interval Note:  04/25/2020 11:01 AM  Rita Ballard  has presented today for surgery, with the diagnosis of menorrhgia.  The various methods of treatment have been discussed with the patient and family. After consideration of risks, benefits and other options for treatment, the patient has consented to  Procedure(s): XI ROBOTIC ASSISTED LAPAROSCOPIC HYSTERECTOMY AND SALPINGECTOMY (Bilateral) as a surgical intervention.  The patient's history has been reviewed, patient examined, no change in status, stable for surgery.  I have reviewed the patient's chart and labs.  Questions were answered to the patient's satisfaction.     Katharine Look A Gudelia Eugene

## 2020-04-25 NOTE — Anesthesia Procedure Notes (Signed)
Procedure Name: Intubation Date/Time: 04/25/2020 12:43 PM Performed by: Garrel Ridgel, CRNA Pre-anesthesia Checklist: Patient identified, Emergency Drugs available, Suction available and Patient being monitored Patient Re-evaluated:Patient Re-evaluated prior to induction Oxygen Delivery Method: Circle system utilized Preoxygenation: Pre-oxygenation with 100% oxygen Induction Type: IV induction Ventilation: Mask ventilation without difficulty Laryngoscope Size: Mac and 3 Tube type: Oral Tube size: 7.0 mm Number of attempts: 1 Airway Equipment and Method: Stylet and Oral airway Placement Confirmation: ETT inserted through vocal cords under direct vision,  positive ETCO2 and breath sounds checked- equal and bilateral Secured at: 23 cm Tube secured with: Tape Dental Injury: Teeth and Oropharynx as per pre-operative assessment

## 2020-04-26 ENCOUNTER — Encounter (HOSPITAL_COMMUNITY): Payer: Self-pay | Admitting: Obstetrics and Gynecology

## 2020-04-29 LAB — SURGICAL PATHOLOGY

## 2020-06-04 ENCOUNTER — Encounter: Payer: Self-pay | Admitting: Plastic Surgery

## 2020-06-04 ENCOUNTER — Ambulatory Visit: Payer: 59 | Admitting: Plastic Surgery

## 2020-06-04 ENCOUNTER — Ambulatory Visit (INDEPENDENT_AMBULATORY_CARE_PROVIDER_SITE_OTHER): Payer: 59 | Admitting: Plastic Surgery

## 2020-06-04 ENCOUNTER — Other Ambulatory Visit: Payer: Self-pay

## 2020-06-04 DIAGNOSIS — N651 Disproportion of reconstructed breast: Secondary | ICD-10-CM

## 2020-06-04 DIAGNOSIS — Z9013 Acquired absence of bilateral breasts and nipples: Secondary | ICD-10-CM

## 2020-06-04 NOTE — Progress Notes (Signed)
   Subjective:    Patient ID: Rita Ballard, female    DOB: 1976/07/02, 44 y.o.   MRN: [id number]  The patient is a 44 year old female here for follow-up on her breast surgery. She had breast cancer and underwent bilateral mastectomies with reconstruction. Her implants were placed in August. She has Mentor smooth round ultra high profile 400 cc implants in both breasts. She has some asymmetry and loss of volume in the upper medial poles of both sides. She is interested in improved contour and symmetry. She is otherwise doing really well and notices a marked improvement in her range of motion since she has been doing physical therapy.     Review of Systems  Constitutional: Negative.   Eyes: Negative.   Respiratory: Negative.   Cardiovascular: Negative.   Gastrointestinal: Negative.   Endocrine: Negative.   Genitourinary: Negative.   Hematological: Negative.        Objective:   Physical Exam Vitals and nursing note reviewed.  Constitutional:      Appearance: Normal appearance.  HENT:     Head: Normocephalic and atraumatic.  Cardiovascular:     Rate and Rhythm: Normal rate.     Pulses: Normal pulses.  Pulmonary:     Effort: Pulmonary effort is normal.  Neurological:     General: No focal deficit present.     Mental Status: She is alert. Mental status is at baseline.  Psychiatric:        Mood and Affect: Mood normal.        Behavior: Behavior normal.        Assessment & Plan:     ICD-10-CM   1. Acquired absence of breast and absent nipple, bilateral  Z90.13   2. Breast asymmetry following reconstructive surgery  N65.1    Plan for bilateral lipofilling of breasts for improved symmetry.  Then she will also be eligible for nipple areola tattoo placement.  Pictures were obtained of the patient and placed in the chart with the patient's or guardian's permission.

## 2020-06-05 ENCOUNTER — Other Ambulatory Visit: Payer: Self-pay | Admitting: Obstetrics and Gynecology

## 2020-06-13 NOTE — Progress Notes (Signed)
Patient ID: Rita Ballard, female    DOB: [birth year], 44 y.o.   MRN: [id number]  No chief complaint on file.     ICD-10-CM   1. Acquired absence of breast and absent nipple, bilateral  Z90.13   2. Breast asymmetry following reconstructive surgery  N65.1      History of Present Illness: Rita Ballard is a 44 y.o.  female  with a history of bilateral mastectomies followed by bilateral breast reconstruction by Dr. Marla Roe.  She presents for preoperative evaluation for upcoming procedure, liposuction of abdomen for Lipo filling of bilateral breasts, scheduled for 06/27/2020 with Dr. Marla Roe  The patient has not had problems with anesthesia. No history of DVT/PE.  No family history of DVT/PE.  No family or personal history of bleeding or clotting disorders.  Patient is not currently taking any blood thinners.  No history of CVA/MI.   Summary of Previous Visit: Patient with history of bilateral mastectomies followed by bilateral breast reconstruction.  She had implants placed in August.  Implants currently in place are Mentor smooth round ultra high profile 400 cc implants.  She has some asymmetry and loss of volume in the upper medial poles of both breasts.  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   Past Medical History: Allergies: No Known Allergies  Current Medications:  Current Outpatient Medications:  .  acetaminophen (TYLENOL) 500 MG tablet, take 2 tablets po every 6 hours for 5 days then prn-pain, first dose at 11 p.m. today, Disp: 100 tablet, Rfl: 1 .  CHELATED IRON PO, Take 1 tablet by mouth daily. (Patient not taking: Reported on 04/23/2020), Disp: , Rfl:  .  cholecalciferol (VITAMIN D3) 25 MCG (1000 UNIT) tablet, Take 1,000 Units by mouth daily. (Patient not taking: Reported on 04/23/2020), Disp: , Rfl:  .  ibuprofen (ADVIL) 600 MG tablet, take 1 tablet po pc every 6 hours for 5 days then prn-post operative pain; first dose 11 pm today, Disp:  30 tablet, Rfl: 1 .  Multiple Vitamin (MULTIVITAMIN) capsule, Take 1 capsule by mouth daily. (Patient not taking: Reported on 04/23/2020), Disp:  , Rfl:  .  Omega-3 Fatty Acids (FISH OIL) 1000 MG CPDR, Take 1 capsule by mouth daily. (Patient not taking: Reported on 04/09/2020), Disp: , Rfl:  .  oxyCODONE (ROXICODONE) 5 MG immediate release tablet, take 1 tablet po every 6 hours prn-breakthrough post operative pain., Disp: 16 tablet, Rfl: 0  Past Medical Problems: Past Medical History:  Diagnosis Date  . Anemia    iron  . Cancer ([place]) 2021   Ductal left breast -upper left quad  . Complication of anesthesia    slow to wake up with general anesthesia with C/S - no epidural or spinal  . Headache    from breast surgery  . History of heavy vaginal bleeding    Dr Cletis Media - will do hysterectomy after patient recovers from breast ca surgery  . Hx of endometriosis   . Hx of scoliosis    As a child    Past Surgical History: Past Surgical History:  Procedure Laterality Date  . BREAST RECONSTRUCTION WITH PLACEMENT OF TISSUE EXPANDER AND FLEX HD (ACELLULAR HYDRATED DERMIS) Bilateral 11/30/2019   Procedure: BREAST RECONSTRUCTION WITH PLACEMENT OF TISSUE EXPANDER AND FLEX HD (ACELLULAR HYDRATED DERMIS);  Surgeon: Wallace Going, DO;  Location: [place];  Service: Plastics;  Laterality: Bilateral;  . CESAREAN SECTION     x 1  . MASTECTOMY  W/ SENTINEL NODE BIOPSY Bilateral 11/30/2019   Procedure: BILATERAL MASTECTOMY WITH LEFT AXILLARY SENTINEL LYMPH NODE BIOPSY;  Surgeon: Alphonsa Overall, MD;  Location: [place];  Service: General;  Laterality: Bilateral;  BIL PEC BLOCK  . REMOVAL OF BILATERAL TISSUE EXPANDERS WITH PLACEMENT OF BILATERAL BREAST IMPLANTS Bilateral 02/29/2020   Procedure: REMOVAL OF BILATERAL TISSUE EXPANDERS WITH PLACEMENT OF BILATERAL BREAST IMPLANTS;  Surgeon: Wallace Going, DO;  Location: [place];  Service: Plastics;  Laterality: Bilateral;  2 hours, please  .  ROBOTIC ASSISTED LAPAROSCOPIC HYSTERECTOMY AND SALPINGECTOMY Bilateral 04/25/2020   Procedure: XI ROBOTIC ASSISTED LAPAROSCOPIC HYSTERECTOMY AND SALPINGECTOMY;  Surgeon: Delsa Bern, MD;  Location: [facility];  Service: Gynecology;  Laterality: Bilateral;  . WISDOM TOOTH EXTRACTION      Social History: Social History   Socioeconomic History  . Marital status: Married    Spouse name: Not on file  . Number of children: Not on file  . Years of education: Not on file  . Highest education level: Not on file  Occupational History  . Not on file  Tobacco Use  . Smoking status: Never Smoker  . Smokeless tobacco: Never Used  Vaping Use  . Vaping Use: Never used  Substance and Sexual Activity  . Alcohol use: No  . Drug use: No  . Sexual activity: Yes    Birth control/protection: Condom  Other Topics Concern  . Not on file  Social History Narrative  . Not on file   Social Determinants of Health   Financial Resource Strain:   . Difficulty of Paying Living Expenses: Not on file  Food Insecurity:   . Worried About Charity fundraiser in the Last Year: Not on file  . Ran Out of Food in the Last Year: Not on file  Transportation Needs:   . Lack of Transportation (Medical): Not on file  . Lack of Transportation (Non-Medical): Not on file  Physical Activity:   . Days of Exercise per Week: Not on file  . Minutes of Exercise per Session: Not on file  Stress:   . Feeling of Stress : Not on file  Social Connections:   . Frequency of Communication with Friends and Family: Not on file  . Frequency of Social Gatherings with Friends and Family: Not on file  . Attends Religious Services: Not on file  . Active Member of Clubs or Organizations: Not on file  . Attends Archivist Meetings: Not on file  . Marital Status: Not on file  Intimate Partner Violence:   . Fear of Current or Ex-Partner: Not on file  . Emotionally Abused: Not on file  . Physically Abused: Not on file  .  Sexually Abused: Not on file    Family History: No family history on file.  Review of Systems: Review of Systems  Constitutional: Negative.   Respiratory: Negative.   Cardiovascular: Negative.   Gastrointestinal: Negative.   Neurological: Negative for dizziness and weakness.    Physical Exam: Vital Signs There were no vitals taken for this visit. Physical Exam Exam conducted with a chaperone present.  Constitutional:      General: She is not in acute distress.    Appearance: Normal appearance. She is not ill-appearing.  HENT:     Head: Normocephalic and atraumatic.  Eyes:     Pupils: Pupils are equal, round Neck:     Musculoskeletal: Normal range of motion.  Cardiovascular:     Rate and Rhythm: Normal rate and regular  rhythm.     Pulses: Normal pulses.     Heart sounds: Normal heart sounds. No murmur.  Pulmonary:     Effort: Pulmonary effort is normal. No respiratory distress.     Breath sounds: Normal breath sounds. No wheezing.  Abdominal:     General: Abdomen is flat. There is no distension.  Musculoskeletal: Normal range of motion.  Skin:    General: Skin is warm and dry.     Findings: No erythema or rash.  Neurological:     General: No focal deficit present.     Mental Status: She is alert and oriented to person, place, and time. Mental status is at baseline.     Motor: No weakness.  Psychiatric:        Mood and Affect: Mood normal.        Behavior: Behavior normal.    Assessment/Plan: The patient is scheduled for liposuction of abdomen for Lipo filling of bilateral breasts after breast reconstruction with Dr. Marla Roe.  Risks, benefits, and alternatives of procedure discussed, questions answered and consent obtained.    Smoking Status: Non-smoker; Counseling Given?  N/A Last Mammogram: Bilateral mastectomy; Results: N/A  Caprini Score: 6, high; Risk Factors include: Age, BMI greater than 25, history of cancer and length of planned surgery.  Recommendation for mechanical and pharmacological prophylaxis. Encourage early ambulation.   Pictures obtained: 06/04/2020  Post-op Rx sent to pharmacy: Keflex, Norco  Patient was provided with the General Surgical Risk consent document and Pain Medication Agreement prior to their appointment.  They had adequate time to read through the risk consent documents and Pain Medication Agreement. We also discussed them in person together during this preop appointment. All of their questions were answered to their satisfaction.  Recommended calling if they have any further questions.  Risk consent form and Pain Medication Agreement to be scanned into patient's chart.  The risks that can be encountered with and after liposuction were discussed and include the following but no limited to these:  Asymmetry, fluid accumulation, firmness of the area, fat necrosis with death of fat tissue, bleeding, infection, delayed healing, anesthesia risks, skin sensation changes, injury to structures including nerves, blood vessels, and muscles which may be temporary or permanent, allergies to tape, suture materials and glues, blood products, topical preparations or injected agents, skin and contour irregularities, skin discoloration and swelling, deep vein thrombosis, cardiac and pulmonary complications, pain, which may persist, persistent pain, recurrence of the lesion, poor healing of the incision, possible need for revisional surgery or staged procedures. Thiere can also be persistent swelling, poor wound healing, rippling or loose skin, worsening of cellulite, swelling, and thermal burn or heat injury from ultrasound with the ultrasound-assisted lipoplasty technique. Any change in weight fluctuations can alter the outcome.    Electronically signed by: Carola Rhine Felicia Bloomquist, PA-C 06/13/2020 2:35 PM

## 2020-06-13 NOTE — H&P (View-Only) (Signed)
Patient ID: Rita Ballard, female    DOB: 01-04-1976, 44 y.o.   MRN: [id number]  No chief complaint on file.     ICD-10-CM   1. Acquired absence of breast and absent nipple, bilateral  Z90.13   2. Breast asymmetry following reconstructive surgery  N65.1      History of Present Illness: Rita Ballard is a 44 y.o.  female  with a history of bilateral mastectomies followed by bilateral breast reconstruction by Dr. Marla Roe.  She presents for preoperative evaluation for upcoming procedure, liposuction of abdomen for Lipo filling of bilateral breasts, scheduled for 06/27/2020 with Dr. Marla Roe  The patient has not had problems with anesthesia. No history of DVT/PE.  No family history of DVT/PE.  No family or personal history of bleeding or clotting disorders.  Patient is not currently taking any blood thinners.  No history of CVA/MI.   Summary of Previous Visit: Patient with history of bilateral mastectomies followed by bilateral breast reconstruction.  She had implants placed in August.  Implants currently in place are Mentor smooth round ultra high profile 400 cc implants.  She has some asymmetry and loss of volume in the upper medial poles of both breasts.  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   Past Medical History: Allergies: No Known Allergies  Current Medications:  Current Outpatient Medications:  .  acetaminophen (TYLENOL) 500 MG tablet, take 2 tablets po every 6 hours for 5 days then prn-pain, first dose at 11 p.m. today, Disp: 100 tablet, Rfl: 1 .  CHELATED IRON PO, Take 1 tablet by mouth daily. (Patient not taking: Reported on 04/23/2020), Disp: , Rfl:  .  cholecalciferol (VITAMIN D3) 25 MCG (1000 UNIT) tablet, Take 1,000 Units by mouth daily. (Patient not taking: Reported on 04/23/2020), Disp: , Rfl:  .  ibuprofen (ADVIL) 600 MG tablet, take 1 tablet po pc every 6 hours for 5 days then prn-post operative pain; first dose 11 pm today, Disp:  30 tablet, Rfl: 1 .  Multiple Vitamin (MULTIVITAMIN) capsule, Take 1 capsule by mouth daily. (Patient not taking: Reported on 04/23/2020), Disp:  , Rfl:  .  Omega-3 Fatty Acids (FISH OIL) 1000 MG CPDR, Take 1 capsule by mouth daily. (Patient not taking: Reported on 04/09/2020), Disp: , Rfl:  .  oxyCODONE (ROXICODONE) 5 MG immediate release tablet, take 1 tablet po every 6 hours prn-breakthrough post operative pain., Disp: 16 tablet, Rfl: 0  Past Medical Problems: Past Medical History:  Diagnosis Date  . Anemia    iron  . Cancer ([place]) 2021   Ductal left breast -upper left quad  . Complication of anesthesia    slow to wake up with general anesthesia with C/S - no epidural or spinal  . Headache    from breast surgery  . History of heavy vaginal bleeding    Dr Cletis Media - will do hysterectomy after patient recovers from breast ca surgery  . Hx of endometriosis   . Hx of scoliosis    As a child    Past Surgical History: Past Surgical History:  Procedure Laterality Date  . BREAST RECONSTRUCTION WITH PLACEMENT OF TISSUE EXPANDER AND FLEX HD (ACELLULAR HYDRATED DERMIS) Bilateral 11/30/2019   Procedure: BREAST RECONSTRUCTION WITH PLACEMENT OF TISSUE EXPANDER AND FLEX HD (ACELLULAR HYDRATED DERMIS);  Surgeon: Wallace Going, DO;  Location: [place];  Service: Plastics;  Laterality: Bilateral;  . CESAREAN SECTION     x 1  . MASTECTOMY  W/ SENTINEL NODE BIOPSY Bilateral 11/30/2019   Procedure: BILATERAL MASTECTOMY WITH LEFT AXILLARY SENTINEL LYMPH NODE BIOPSY;  Surgeon: Alphonsa Overall, MD;  Location: [place];  Service: General;  Laterality: Bilateral;  BIL PEC BLOCK  . REMOVAL OF BILATERAL TISSUE EXPANDERS WITH PLACEMENT OF BILATERAL BREAST IMPLANTS Bilateral 02/29/2020   Procedure: REMOVAL OF BILATERAL TISSUE EXPANDERS WITH PLACEMENT OF BILATERAL BREAST IMPLANTS;  Surgeon: Wallace Going, DO;  Location: [place];  Service: Plastics;  Laterality: Bilateral;  2 hours, please  .  ROBOTIC ASSISTED LAPAROSCOPIC HYSTERECTOMY AND SALPINGECTOMY Bilateral 04/25/2020   Procedure: XI ROBOTIC ASSISTED LAPAROSCOPIC HYSTERECTOMY AND SALPINGECTOMY;  Surgeon: Delsa Bern, MD;  Location: [facility];  Service: Gynecology;  Laterality: Bilateral;  . WISDOM TOOTH EXTRACTION      Social History: Social History   Socioeconomic History  . Marital status: Married    Spouse name: Not on file  . Number of children: Not on file  . Years of education: Not on file  . Highest education level: Not on file  Occupational History  . Not on file  Tobacco Use  . Smoking status: Never Smoker  . Smokeless tobacco: Never Used  Vaping Use  . Vaping Use: Never used  Substance and Sexual Activity  . Alcohol use: No  . Drug use: No  . Sexual activity: Yes    Birth control/protection: Condom  Other Topics Concern  . Not on file  Social History Narrative  . Not on file   Social Determinants of Health   Financial Resource Strain:   . Difficulty of Paying Living Expenses: Not on file  Food Insecurity:   . Worried About Charity fundraiser in the Last Year: Not on file  . Ran Out of Food in the Last Year: Not on file  Transportation Needs:   . Lack of Transportation (Medical): Not on file  . Lack of Transportation (Non-Medical): Not on file  Physical Activity:   . Days of Exercise per Week: Not on file  . Minutes of Exercise per Session: Not on file  Stress:   . Feeling of Stress : Not on file  Social Connections:   . Frequency of Communication with Friends and Family: Not on file  . Frequency of Social Gatherings with Friends and Family: Not on file  . Attends Religious Services: Not on file  . Active Member of Clubs or Organizations: Not on file  . Attends Archivist Meetings: Not on file  . Marital Status: Not on file  Intimate Partner Violence:   . Fear of Current or Ex-Partner: Not on file  . Emotionally Abused: Not on file  . Physically Abused: Not on file  .  Sexually Abused: Not on file    Family History: No family history on file.  Review of Systems: Review of Systems  Constitutional: Negative.   Respiratory: Negative.   Cardiovascular: Negative.   Gastrointestinal: Negative.   Neurological: Negative for dizziness and weakness.    Physical Exam: Vital Signs There were no vitals taken for this visit. Physical Exam Exam conducted with a chaperone present.  Constitutional:      General: She is not in acute distress.    Appearance: Normal appearance. She is not ill-appearing.  HENT:     Head: Normocephalic and atraumatic.  Eyes:     Pupils: Pupils are equal, round Neck:     Musculoskeletal: Normal range of motion.  Cardiovascular:     Rate and Rhythm: Normal rate and regular  rhythm.     Pulses: Normal pulses.     Heart sounds: Normal heart sounds. No murmur.  Pulmonary:     Effort: Pulmonary effort is normal. No respiratory distress.     Breath sounds: Normal breath sounds. No wheezing.  Abdominal:     General: Abdomen is flat. There is no distension.  Musculoskeletal: Normal range of motion.  Skin:    General: Skin is warm and dry.     Findings: No erythema or rash.  Neurological:     General: No focal deficit present.     Mental Status: She is alert and oriented to person, place, and time. Mental status is at baseline.     Motor: No weakness.  Psychiatric:        Mood and Affect: Mood normal.        Behavior: Behavior normal.    Assessment/Plan: The patient is scheduled for liposuction of abdomen for Lipo filling of bilateral breasts after breast reconstruction with Dr. Marla Roe.  Risks, benefits, and alternatives of procedure discussed, questions answered and consent obtained.    Smoking Status: Non-smoker; Counseling Given?  N/A Last Mammogram: Bilateral mastectomy; Results: N/A  Caprini Score: 6, high; Risk Factors include: Age, BMI greater than 25, history of cancer and length of planned surgery.  Recommendation for mechanical and pharmacological prophylaxis. Encourage early ambulation.   Pictures obtained: 06/04/2020  Post-op Rx sent to pharmacy: Keflex, Norco  Patient was provided with the General Surgical Risk consent document and Pain Medication Agreement prior to their appointment.  They had adequate time to read through the risk consent documents and Pain Medication Agreement. We also discussed them in person together during this preop appointment. All of their questions were answered to their satisfaction.  Recommended calling if they have any further questions.  Risk consent form and Pain Medication Agreement to be scanned into patient's chart.  The risks that can be encountered with and after liposuction were discussed and include the following but no limited to these:  Asymmetry, fluid accumulation, firmness of the area, fat necrosis with death of fat tissue, bleeding, infection, delayed healing, anesthesia risks, skin sensation changes, injury to structures including nerves, blood vessels, and muscles which may be temporary or permanent, allergies to tape, suture materials and glues, blood products, topical preparations or injected agents, skin and contour irregularities, skin discoloration and swelling, deep vein thrombosis, cardiac and pulmonary complications, pain, which may persist, persistent pain, recurrence of the lesion, poor healing of the incision, possible need for revisional surgery or staged procedures. Thiere can also be persistent swelling, poor wound healing, rippling or loose skin, worsening of cellulite, swelling, and thermal burn or heat injury from ultrasound with the ultrasound-assisted lipoplasty technique. Any change in weight fluctuations can alter the outcome.    Electronically signed by: Carola Rhine Brandis Wixted, PA-C 06/13/2020 2:35 PM

## 2020-06-14 ENCOUNTER — Ambulatory Visit (INDEPENDENT_AMBULATORY_CARE_PROVIDER_SITE_OTHER): Payer: 59 | Admitting: Surgical

## 2020-06-14 ENCOUNTER — Encounter: Payer: Self-pay | Admitting: Surgical

## 2020-06-14 ENCOUNTER — Other Ambulatory Visit: Payer: Self-pay

## 2020-06-14 VITALS — BP 118/79 | HR 72 | Temp 98.8°F | Ht 68.0 in | Wt 184.4 lb

## 2020-06-14 DIAGNOSIS — N651 Disproportion of reconstructed breast: Secondary | ICD-10-CM

## 2020-06-14 DIAGNOSIS — Z9013 Acquired absence of bilateral breasts and nipples: Secondary | ICD-10-CM

## 2020-06-14 MED ORDER — CEPHALEXIN 500 MG PO CAPS: 500.0000 mg | ORAL_CAPSULE | Freq: Four times a day (QID) | ORAL | 0 refills | Status: AC

## 2020-06-14 MED ORDER — HYDROCODONE-ACETAMINOPHEN 5-325 MG PO TABS
1.0000 | ORAL_TABLET | Freq: Four times a day (QID) | ORAL | 0 refills | Status: AC | PRN
Start: 2020-06-14 — End: 2020-06-19

## 2020-06-20 ENCOUNTER — Encounter (HOSPITAL_BASED_OUTPATIENT_CLINIC_OR_DEPARTMENT_OTHER): Payer: Self-pay | Admitting: Plastic Surgery

## 2020-06-20 ENCOUNTER — Other Ambulatory Visit: Payer: Self-pay

## 2020-06-24 ENCOUNTER — Other Ambulatory Visit (HOSPITAL_COMMUNITY)
Admission: RE | Admit: 2020-06-24 | Discharge: 2020-06-24 | Disposition: A | Discharge status: Home / Self Care | Payer: 59 | Source: Ambulatory Visit | Attending: Plastic Surgery | Admitting: Plastic Surgery

## 2020-06-24 DIAGNOSIS — Z20822 Contact with and (suspected) exposure to covid-19: Secondary | ICD-10-CM | POA: Insufficient documentation

## 2020-06-24 DIAGNOSIS — Z01812 Encounter for preprocedural laboratory examination: Secondary | ICD-10-CM | POA: Diagnosis present

## 2020-06-24 LAB — SARS CORONAVIRUS 2 (TAT 6-24 HRS): SARS Coronavirus 2: NEGATIVE

## 2020-06-26 ENCOUNTER — Encounter (HOSPITAL_BASED_OUTPATIENT_CLINIC_OR_DEPARTMENT_OTHER): Payer: Self-pay | Admitting: Plastic Surgery

## 2020-06-27 ENCOUNTER — Ambulatory Visit (HOSPITAL_BASED_OUTPATIENT_CLINIC_OR_DEPARTMENT_OTHER): Payer: 59 | Admitting: Anesthesiology

## 2020-06-27 ENCOUNTER — Ambulatory Visit (HOSPITAL_BASED_OUTPATIENT_CLINIC_OR_DEPARTMENT_OTHER)
Admission: RE | Admit: 2020-06-27 | Discharge: 2020-06-27 | Disposition: A | Discharge status: Home / Self Care | Payer: 59 | Attending: Plastic Surgery | Admitting: Plastic Surgery

## 2020-06-27 ENCOUNTER — Encounter (HOSPITAL_BASED_OUTPATIENT_CLINIC_OR_DEPARTMENT_OTHER)
Admission: RE | Disposition: A | Discharge status: Home / Self Care | Payer: Self-pay | Source: Home / Self Care | Attending: Plastic Surgery

## 2020-06-27 ENCOUNTER — Other Ambulatory Visit: Payer: Self-pay

## 2020-06-27 ENCOUNTER — Encounter (HOSPITAL_BASED_OUTPATIENT_CLINIC_OR_DEPARTMENT_OTHER): Payer: Self-pay | Admitting: Plastic Surgery

## 2020-06-27 DIAGNOSIS — Z79899 Other long term (current) drug therapy: Secondary | ICD-10-CM | POA: Insufficient documentation

## 2020-06-27 DIAGNOSIS — N651 Disproportion of reconstructed breast: Secondary | ICD-10-CM | POA: Diagnosis not present

## 2020-06-27 DIAGNOSIS — N6489 Other specified disorders of breast: Secondary | ICD-10-CM | POA: Insufficient documentation

## 2020-06-27 DIAGNOSIS — Z853 Personal history of malignant neoplasm of breast: Secondary | ICD-10-CM

## 2020-06-27 DIAGNOSIS — Z421 Encounter for breast reconstruction following mastectomy: Secondary | ICD-10-CM | POA: Diagnosis not present

## 2020-06-27 DIAGNOSIS — N641 Fat necrosis of breast: Secondary | ICD-10-CM

## 2020-06-27 DIAGNOSIS — Z9013 Acquired absence of bilateral breasts and nipples: Secondary | ICD-10-CM | POA: Diagnosis not present

## 2020-06-27 HISTORY — PX: LIPOSUCTION WITH LIPOFILLING: SHX6436

## 2020-06-27 SURGERY — LIPOSUCTION, WITH FAT TRANSFER
Anesthesia: General | Site: Breast | Laterality: Bilateral

## 2020-06-27 MED ORDER — OXYCODONE HCL 5 MG PO TABS: 5.0000 mg | ORAL_TABLET | ORAL | Status: DC | PRN

## 2020-06-27 MED ORDER — DEXAMETHASONE SODIUM PHOSPHATE 4 MG/ML IJ SOLN
INTRAMUSCULAR | Status: DC | PRN
  Administered 2020-06-27: 10 mg via INTRAVENOUS

## 2020-06-27 MED ORDER — ACETAMINOPHEN 500 MG PO TABS
1000.0000 mg | ORAL_TABLET | Freq: Four times a day (QID) | ORAL | Status: DC | PRN
  Administered 2020-06-27: 1000 mg via ORAL

## 2020-06-27 MED ORDER — PHENYLEPHRINE 40 MCG/ML (10ML) SYRINGE FOR IV PUSH (FOR BLOOD PRESSURE SUPPORT)
PREFILLED_SYRINGE | INTRAVENOUS | Status: AC
  Filled 2020-06-27: qty 10

## 2020-06-27 MED ORDER — EPINEPHRINE PF 1 MG/ML IJ SOLN
INTRAMUSCULAR | Status: AC
  Filled 2020-06-27: qty 1

## 2020-06-27 MED ORDER — ACETAMINOPHEN 325 MG RE SUPP: 650.0000 mg | RECTAL | Status: DC | PRN

## 2020-06-27 MED ORDER — FENTANYL CITRATE (PF) 100 MCG/2ML IJ SOLN
25.0000 ug | INTRAMUSCULAR | Status: DC | PRN
Start: 2020-06-27 — End: 2020-06-27

## 2020-06-27 MED ORDER — LACTATED RINGERS IV SOLN: INTRAVENOUS | Status: DC

## 2020-06-27 MED ORDER — MIDAZOLAM HCL 5 MG/5ML IJ SOLN
INTRAMUSCULAR | Status: DC | PRN
  Administered 2020-06-27: 2 mg via INTRAVENOUS

## 2020-06-27 MED ORDER — ACETAMINOPHEN 325 MG PO TABS: 650.0000 mg | ORAL_TABLET | ORAL | Status: DC | PRN

## 2020-06-27 MED ORDER — SUFENTANIL CITRATE 50 MCG/ML IV SOLN
INTRAVENOUS | Status: DC | PRN
  Administered 2020-06-27: 5 ug via INTRAVENOUS
  Administered 2020-06-27: 10 ug via INTRAVENOUS

## 2020-06-27 MED ORDER — SUFENTANIL CITRATE 50 MCG/ML IV SOLN
INTRAVENOUS | Status: AC
  Filled 2020-06-27: qty 1

## 2020-06-27 MED ORDER — SODIUM CHLORIDE 0.9 % IV SOLN: 250.0000 mL | INTRAVENOUS | Status: DC | PRN

## 2020-06-27 MED ORDER — LIDOCAINE HCL (PF) 1 % IJ SOLN
INTRAMUSCULAR | Status: AC
  Filled 2020-06-27: qty 60

## 2020-06-27 MED ORDER — CELECOXIB 200 MG PO CAPS
200.0000 mg | ORAL_CAPSULE | Freq: Once | ORAL | Status: AC
  Administered 2020-06-27: 10:00:00 200 mg via ORAL

## 2020-06-27 MED ORDER — LIDOCAINE-EPINEPHRINE 1 %-1:100000 IJ SOLN
INTRAMUSCULAR | Status: DC | PRN
  Administered 2020-06-27: 20 mL

## 2020-06-27 MED ORDER — ONDANSETRON HCL 4 MG/2ML IJ SOLN
INTRAMUSCULAR | Status: DC | PRN
  Administered 2020-06-27: 4 mg via INTRAVENOUS

## 2020-06-27 MED ORDER — CELECOXIB 200 MG PO CAPS
ORAL_CAPSULE | ORAL | Status: AC
  Filled 2020-06-27: qty 1

## 2020-06-27 MED ORDER — SODIUM CHLORIDE 0.9% FLUSH: 3.0000 mL | Freq: Two times a day (BID) | INTRAVENOUS | Status: DC

## 2020-06-27 MED ORDER — EPHEDRINE 5 MG/ML INJ
INTRAVENOUS | Status: AC
  Filled 2020-06-27: qty 10

## 2020-06-27 MED ORDER — BUPIVACAINE HCL (PF) 0.25 % IJ SOLN
INTRAMUSCULAR | Status: DC | PRN
  Administered 2020-06-27: 30 mL

## 2020-06-27 MED ORDER — CEFAZOLIN SODIUM-DEXTROSE 2-4 GM/100ML-% IV SOLN
2.0000 g | INTRAVENOUS | Status: AC
  Administered 2020-06-27: 10:00:00 2 g via INTRAVENOUS

## 2020-06-27 MED ORDER — PROPOFOL 10 MG/ML IV BOLUS
INTRAVENOUS | Status: AC
  Filled 2020-06-27: qty 20

## 2020-06-27 MED ORDER — BUPIVACAINE HCL (PF) 0.25 % IJ SOLN
INTRAMUSCULAR | Status: AC
  Filled 2020-06-27: qty 30

## 2020-06-27 MED ORDER — CHLORHEXIDINE GLUCONATE CLOTH 2 % EX PADS: 6.0000 | MEDICATED_PAD | Freq: Once | CUTANEOUS | Status: DC

## 2020-06-27 MED ORDER — LIDOCAINE HCL (CARDIAC) PF 100 MG/5ML IV SOSY
PREFILLED_SYRINGE | INTRAVENOUS | Status: DC | PRN
  Administered 2020-06-27: 60 mg via INTRAVENOUS

## 2020-06-27 MED ORDER — LIDOCAINE-EPINEPHRINE 1 %-1:100000 IJ SOLN
INTRAMUSCULAR | Status: AC
  Filled 2020-06-27: qty 1

## 2020-06-27 MED ORDER — SODIUM CHLORIDE 0.9% FLUSH: 3.0000 mL | INTRAVENOUS | Status: DC | PRN

## 2020-06-27 MED ORDER — LIDOCAINE 2% (20 MG/ML) 5 ML SYRINGE
INTRAMUSCULAR | Status: AC
  Filled 2020-06-27: qty 5

## 2020-06-27 MED ORDER — ACETAMINOPHEN 500 MG PO TABS
ORAL_TABLET | ORAL | Status: AC
  Filled 2020-06-27: qty 2

## 2020-06-27 MED ORDER — SUCCINYLCHOLINE CHLORIDE 200 MG/10ML IV SOSY
PREFILLED_SYRINGE | INTRAVENOUS | Status: AC
  Filled 2020-06-27: qty 10

## 2020-06-27 MED ORDER — DIPHENHYDRAMINE HCL 50 MG/ML IJ SOLN
INTRAMUSCULAR | Status: DC | PRN
  Administered 2020-06-27: 6.25 mg via INTRAVENOUS

## 2020-06-27 MED ORDER — DIPHENHYDRAMINE HCL 50 MG/ML IJ SOLN
INTRAMUSCULAR | Status: AC
  Filled 2020-06-27: qty 1

## 2020-06-27 MED ORDER — CEFAZOLIN SODIUM-DEXTROSE 2-4 GM/100ML-% IV SOLN
INTRAVENOUS | Status: AC
  Filled 2020-06-27: qty 100

## 2020-06-27 MED ORDER — ONDANSETRON HCL 4 MG/2ML IJ SOLN
INTRAMUSCULAR | Status: AC
  Filled 2020-06-27: qty 2

## 2020-06-27 MED ORDER — LIDOCAINE HCL 1 % IJ SOLN
INTRAVENOUS | Status: DC | PRN
  Administered 2020-06-27: 11:00:00 700 mL

## 2020-06-27 MED ORDER — HYDROMORPHONE HCL 1 MG/ML IJ SOLN: 0.2500 mg | INTRAMUSCULAR | Status: DC | PRN

## 2020-06-27 MED ORDER — MIDAZOLAM HCL 2 MG/2ML IJ SOLN
INTRAMUSCULAR | Status: AC
  Filled 2020-06-27: qty 2

## 2020-06-27 MED ORDER — DEXAMETHASONE SODIUM PHOSPHATE 10 MG/ML IJ SOLN
INTRAMUSCULAR | Status: AC
  Filled 2020-06-27: qty 1

## 2020-06-27 SURGICAL SUPPLY — 52 items
ADH SKN CLS APL DERMABOND .7 (GAUZE/BANDAGES/DRESSINGS) ×1
BAG DRN INLT TBG SET TISS ACC (MISCELLANEOUS) ×1
BINDER ABDOMINAL  9 SM 30-45 (SOFTGOODS) ×2
BINDER ABDOMINAL 10 UNV 27-48 (MISCELLANEOUS) IMPLANT
BINDER ABDOMINAL 12 SM 30-45 (SOFTGOODS) IMPLANT
BINDER ABDOMINAL 9 SM 30-45 (SOFTGOODS) ×1 IMPLANT
BINDER BREAST LRG (GAUZE/BANDAGES/DRESSINGS) ×2 IMPLANT
BINDER BREAST MEDIUM (GAUZE/BANDAGES/DRESSINGS) IMPLANT
BINDER BREAST XLRG (GAUZE/BANDAGES/DRESSINGS) IMPLANT
BINDER BREAST XXLRG (GAUZE/BANDAGES/DRESSINGS) IMPLANT
BLADE HEX COATED 2.75 (ELECTRODE) IMPLANT
BLADE SURG 15 STRL LF DISP TIS (BLADE) ×1 IMPLANT
BLADE SURG 15 STRL SS (BLADE) ×2
BNDG GAUZE ELAST 4 BULKY (GAUZE/BANDAGES/DRESSINGS) ×4 IMPLANT
COVER BACK TABLE 60X90IN (DRAPES) ×2 IMPLANT
COVER MAYO STAND STRL (DRAPES) ×2 IMPLANT
COVER WAND RF STERILE (DRAPES) IMPLANT
DECANTER SPIKE VIAL GLASS SM (MISCELLANEOUS) IMPLANT
DERMABOND ADVANCED (GAUZE/BANDAGES/DRESSINGS) ×1
DERMABOND ADVANCED .7 DNX12 (GAUZE/BANDAGES/DRESSINGS) ×1 IMPLANT
DRAPE LAPAROSCOPIC ABDOMINAL (DRAPES) ×2 IMPLANT
DRSG HYDROCOLLOID 4X4 (GAUZE/BANDAGES/DRESSINGS) ×4 IMPLANT
DRSG PAD ABDOMINAL 8X10 ST (GAUZE/BANDAGES/DRESSINGS) ×4 IMPLANT
ELECT REM PT RETURN 9FT ADLT (ELECTROSURGICAL) ×2
ELECTRODE REM PT RTRN 9FT ADLT (ELECTROSURGICAL) ×1 IMPLANT
EXTRACTOR CANIST REVOLVE STRL (CANNISTER) IMPLANT
GLOVE BIO SURGEON STRL SZ 6.5 (GLOVE) ×4 IMPLANT
GOWN STRL REUS W/ TWL LRG LVL3 (GOWN DISPOSABLE) ×2 IMPLANT
GOWN STRL REUS W/TWL LRG LVL3 (GOWN DISPOSABLE) ×4
IV LACTATED RINGERS 1000ML (IV SOLUTION) ×4 IMPLANT
LINER CANISTER 1000CC FLEX (MISCELLANEOUS) ×2 IMPLANT
NDL SAFETY ECLIPSE 18X1.5 (NEEDLE) ×1 IMPLANT
NEEDLE HYPO 18GX1.5 SHARP (NEEDLE) ×2
NEEDLE HYPO 25X1 1.5 SAFETY (NEEDLE) IMPLANT
PACK BASIN DAY SURGERY FS (CUSTOM PROCEDURE TRAY) ×2 IMPLANT
PAD ALCOHOL SWAB (MISCELLANEOUS) ×2 IMPLANT
PENCIL SMOKE EVACUATOR (MISCELLANEOUS) IMPLANT
SLEEVE SCD COMPRESS KNEE MED (MISCELLANEOUS) ×2 IMPLANT
SPONGE LAP 18X18 RF (DISPOSABLE) ×2 IMPLANT
SUT MNCRL AB 4-0 PS2 18 (SUTURE) IMPLANT
SUT MON AB 5-0 PS2 18 (SUTURE) ×4 IMPLANT
SYR 10ML LL (SYRINGE) ×8 IMPLANT
SYR 3ML 18GX1 1/2 (SYRINGE) IMPLANT
SYR 50ML LL SCALE MARK (SYRINGE) ×4 IMPLANT
SYR CONTROL 10ML LL (SYRINGE) ×2 IMPLANT
SYR TOOMEY 50ML (SYRINGE) ×4 IMPLANT
SYSTEM FAT FILTRATION 250 (MISCELLANEOUS) ×2 IMPLANT
TOWEL GREEN STERILE FF (TOWEL DISPOSABLE) ×4 IMPLANT
TRAY DSU PREP LF (CUSTOM PROCEDURE TRAY) ×2 IMPLANT
TUBING INFILTRATION IT-10001 (TUBING) IMPLANT
TUBING SET GRADUATE ASPIR 12FT (MISCELLANEOUS) ×2 IMPLANT
UNDERPAD 30X36 HEAVY ABSORB (UNDERPADS AND DIAPERS) ×4 IMPLANT

## 2020-06-27 NOTE — Anesthesia Postprocedure Evaluation (Signed)
Anesthesia Post Note  Patient: Rita Ballard  Procedure(s) Performed: fat filling of both breasts (Bilateral Breast)     Patient location during evaluation: PACU Anesthesia Type: General Level of consciousness: awake and alert Pain management: pain level controlled Vital Signs Assessment: post-procedure vital signs reviewed and stable Respiratory status: spontaneous breathing, nonlabored ventilation and respiratory function stable Cardiovascular status: blood pressure returned to baseline and stable Postop Assessment: no apparent nausea or vomiting Anesthetic complications: no   No complications documented.  Last Vitals:  Vitals:   06/27/20 1235 06/27/20 1240  BP:    Pulse: 63 64  Resp: 17 17  Temp:    SpO2: 100% 99%    Last Pain:  Vitals:   06/27/20 1230  TempSrc:   PainSc: 0-No pain                 Zannah Melucci,W. EDMOND

## 2020-06-27 NOTE — Op Note (Signed)
DATE OF OPERATION: 06/27/2020  LOCATION: [facility]  PREOPERATIVE DIAGNOSIS: breast asymmetry after mastectomies  POSTOPERATIVE DIAGNOSIS: Same  PROCEDURE: Lipofilling of bilateral breasts for symmetry  SURGEON: Elisabel Hanover Sanger Scherrie Seneca, DO  ASSISTANT: Roetta Sessions, PA  EBL: 10 cc  CONDITION: Stable  COMPLICATIONS: None  INDICATION: The patient, Rita Ballard, is a 44 y.o. female born on 13-Jun-1976, is here for treatment of bilateral breast asymmetry after mastectomies and reconstruction.   PROCEDURE DETAILS:  The patient was seen prior to surgery and marked.  The IV antibiotics were given. The patient was taken to the operating room and given a general anesthetic. A standard time out was performed and all information was confirmed by those in the room. SCDs were placed.   The chest and abdomen were prepped and draped.  Local with epinephrine was used to inject in the incision sites.  The patient had laparoscopic incision scars so these were utilized on the left and right side of the abdomen.  A #15 blade was used to make a 2 mm incision.  The tumescent was infused.  After waiting several minutes for the tumescent to take effect the liposuction was performed.  The pure graft device was utilized to collect the graft.  It was prepared according to the manufacture guidelines with 3 rinses.  The fat was then placed in 10 cc syringes.  A 15 blade was used to make an incision at the previous incision site of the mastectomies.  The medial aspect of both breasts was utilized.  Using a blunt cannula the fat was injected to improve the symmetry and form of each breast.  The majority was placed in the superior medial and inferior medial aspect of the breast.  120 cc was utilized on the right and 120 cc was placed on the left.  Incisions were closed with 5-0 Monocryl.  Sterile dressings were applied with breast binder and an abdominal binder. The patient was allowed to wake up and  taken to recovery room in stable condition at the end of the case. The family was notified at the end of the case.   The advanced practice practitioner (APP) assisted throughout the case.  The APP was essential in retraction and counter traction when needed to make the case progress smoothly.  This retraction and assistance made it possible to see the tissue plans for the procedure.  The assistance was needed for blood control, tissue re-approximation and assisted with closure of the incision site.

## 2020-06-27 NOTE — Interval H&P Note (Signed)
History and Physical Interval Note:  06/27/2020 9:55 AM  Rita Ballard  has presented today for surgery, with the diagnosis of post mastectomy, history of breast cancer.  The various methods of treatment have been discussed with the patient and family. After consideration of risks, benefits and other options for treatment, the patient has consented to  Procedure(s) with comments: fat filling of both breasts (Bilateral) - 1.5 hours as a surgical intervention.  The patient's history has been reviewed, patient examined, no change in status, stable for surgery.  I have reviewed the patient's chart and labs.  Questions were answered to the patient's satisfaction.     Loel Lofty Carle Dargan

## 2020-06-27 NOTE — Discharge Instructions (Addendum)
INSTRUCTIONS FOR AFTER SURGERY   You will likely have some questions about what to expect following your operation.  The following information will help you and your family understand what to expect when you are discharged from the hospital.  Following these guidelines will help ensure a smooth recovery and reduce risks of complications.  Postoperative instructions include information on: diet, wound care, medications and physical activity.  AFTER SURGERY Expect to go home after the procedure.  In some cases, you may need to spend one night in the hospital for observation.  DIET This surgery does not require a specific diet.  However, I have to mention that the healthier you eat the better your body can start healing. It is important to increasing your protein intake.  This means limiting the foods with added sugar.  Focus on fruits and vegetables and some meat. It is very important to drink water after your surgery.  If your urine is bright yellow, then it is concentrated, and you need to drink more water.  As a general rule after surgery, you should have 8 ounces of water every hour while awake.  If you find you are persistently nauseated or unable to take in liquids let us know.  NO TOBACCO USE or EXPOSURE.  This will slow your healing process and increase the risk of a wound.  WOUND CARE You can shower the day after surgery.  Use fragrance free soap.  Dial, [place], [place] and Cetaphil are usually mild on the skin.    If you have steri-strips / tape directly attached to your skin leave them in place. It is OK to get these wet.  No baths, pools or hot tubs for two weeks. We close your incision to leave the smallest and best-looking scar. No ointment or creams on your incisions until given the go ahead.  Especially not Neosporin (Too many skin reactions with this one).  A few weeks after surgery you can use Mederma and start massaging the scar. We ask you to wear your binder or sports bra for the first 6  weeks around the clock, including while sleeping. This provides added comfort and helps reduce the fluid accumulation at the surgery site.  ACTIVITY No heavy lifting until cleared by the doctor.  It is OK to walk and climb stairs. In fact, moving your legs is very important to decrease your risk of a blood clot.  It will also help keep you from getting deconditioned.  Every 1 to 2 hours get up and walk for 5 minutes. This will help with a quicker recovery back to normal.  Let pain be your guide so you don't do too much.  NO, you cannot do the spring cleaning and don't plan on taking care of anyone else.  This is your time for TLC.   WORK Everyone returns to work at different times. As a rough guide, most people take at least 1 - 2 weeks off prior to returning to work. If you need documentation for your job, bring the forms to your postoperative follow up visit.  DRIVING Arrange for someone to bring you home from the hospital.  You may be able to drive a few days after surgery but not while taking any narcotics or valium.  BOWEL MOVEMENTS Constipation can occur after anesthesia and while taking pain medication.  It is important to stay ahead for your comfort.  We recommend taking Milk of Magnesia (2 tablespoons; twice a day) while taking the pain pills.  SEROMA This is fluid your body tried to put in the surgical site.  This is normal but if it creates excessive pain and swelling let us know.  It usually decreases in a few weeks.  MEDICATIONS and PAIN CONTROL At your preoperative visit for you history and physical you were given the following medications: 1. An antibiotic: Start this medication when you get home and take according to the instructions on the bottle. 2. Zofran 4 mg:  This is to treat nausea and vomiting.  You can take this every 6 hours as needed and only if needed. 3. Norco (hydrocodone/acetaminophen) 5/325 mg:  This is only to be used after you have taken the motrin or the  tylenol. Every 8 hours as needed. Over the counter Medication to take: 4. Ibuprofen (Motrin) 600 mg:  Take this every 6 hours.  If you have additional pain then take 500 mg of the tylenol.  Only take the Norco after you have tried these two. 5. Miralax or stool softener of choice: Take this according to the bottle if you take the [place] Call your surgeon's office if any of the following occur: . Fever 101 degrees F or greater . Excessive bleeding or fluid from the incision site. . Pain that increases over time without aid from the medications . Redness, warmth, or pus draining from incision sites . Persistent nausea or inability to take in liquids . Severe misshapen area that underwent the operation.    NO TYLENOL PRODUCTS UNTIL 4:15.  NO ADVIL< IBUPROFEN UNTIL 6:15   Post Anesthesia Home Care Instructions  Activity: Get plenty of rest for the remainder of the day. A responsible individual must stay with you for 24 hours following the procedure.  For the next 24 hours, DO NOT: -Drive a car -Paediatric nurse -Drink alcoholic beverages -Take any medication unless instructed by your physician -Make any legal decisions or sign important papers.  Meals: Start with liquid foods such as gelatin or soup. Progress to regular foods as tolerated. Avoid greasy, spicy, heavy foods. If nausea and/or vomiting occur, drink only clear liquids until the nausea and/or vomiting subsides. Call your physician if vomiting continues.  Special Instructions/Symptoms: Your throat may feel dry or sore from the anesthesia or the breathing tube placed in your throat during surgery. If this causes discomfort, gargle with warm salt water. The discomfort should disappear within 24 hours.  If you had a scopolamine patch placed behind your ear for the management of post- operative nausea and/or vomiting:  1. The medication in the patch is effective for 72 hours, after which it should be removed.   Wrap patch in a tissue and discard in the trash. Wash hands thoroughly with soap and water. 2. You may remove the patch earlier than 72 hours if you experience unpleasant side effects which may include dry mouth, dizziness or visual disturbances. 3. Avoid touching the patch. Wash your hands with soap and water after contact with the patch.

## 2020-06-27 NOTE — Transfer of Care (Signed)
Immediate Anesthesia Transfer of Care Note  Patient: Rita Ballard  Procedure(s) Performed: fat filling of both breasts (Bilateral Breast)  Patient Location: PACU  Anesthesia Type:General  Level of Consciousness: awake  Airway & Oxygen Therapy: Patient Spontanous Breathing and Patient connected to face mask oxygen  Post-op Assessment: Report given to RN and Post -op Vital signs reviewed and stable  Post vital signs: Reviewed and stable  Last Vitals:  Vitals Value Taken Time  BP    Temp    Pulse 93 06/27/20 1203  Resp    SpO2 98 % 06/27/20 1203  Vitals shown include unvalidated device data.  Last Pain:  Vitals:   06/27/20 1007  TempSrc: Oral  PainSc: 0-No pain         Complications: No complications documented.

## 2020-06-27 NOTE — Anesthesia Preprocedure Evaluation (Signed)
Anesthesia Evaluation  Patient identified by MRN, date of birth, ID band Patient awake    Reviewed: Allergy & Precautions, H&P , NPO status , Patient's Chart, lab work & pertinent test results  Airway Mallampati: II  TM Distance: >3 FB Neck ROM: Full    Dental no notable dental hx. (+) Teeth Intact, Dental Advisory Given   Pulmonary neg pulmonary ROS,    Pulmonary exam normal breath sounds clear to auscultation       Cardiovascular negative cardio ROS   Rhythm:Regular Rate:Normal     Neuro/Psych  Headaches, negative psych ROS   GI/Hepatic negative GI ROS, Neg liver ROS,   Endo/Other  negative endocrine ROS  Renal/GU negative Renal ROS  negative genitourinary   Musculoskeletal   Abdominal   Peds  Hematology  (+) Blood dyscrasia, anemia ,   Anesthesia Other Findings   Reproductive/Obstetrics negative OB ROS                             Anesthesia Physical Anesthesia Plan  ASA: II  Anesthesia Plan: General   Post-op Pain Management:    Induction: Intravenous  PONV Risk Score and Plan: 4 or greater and Ondansetron, Dexamethasone and Midazolam  Airway Management Planned: LMA  Additional Equipment:   Intra-op Plan:   Post-operative Plan: Extubation in OR  Informed Consent: I have reviewed the patients History and Physical, chart, labs and discussed the procedure including the risks, benefits and alternatives for the proposed anesthesia with the patient or authorized representative who has indicated his/her understanding and acceptance.     Dental advisory given  Plan Discussed with: CRNA  Anesthesia Plan Comments:         Anesthesia Quick Evaluation

## 2020-06-27 NOTE — Anesthesia Procedure Notes (Signed)
Procedure Name: LMA Insertion Date/Time: 06/27/2020 10:37 AM Performed by: Willa Frater, CRNA Pre-anesthesia Checklist: Patient identified, Emergency Drugs available, Suction available and Patient being monitored Patient Re-evaluated:Patient Re-evaluated prior to induction Oxygen Delivery Method: Circle system utilized Preoxygenation: Pre-oxygenation with 100% oxygen Induction Type: IV induction Ventilation: Mask ventilation without difficulty LMA: LMA inserted LMA Size: 4.0 Number of attempts: 1 Airway Equipment and Method: Bite block Placement Confirmation: positive ETCO2 Tube secured with: Tape Dental Injury: Teeth and Oropharynx as per pre-operative assessment

## 2020-06-28 ENCOUNTER — Encounter (HOSPITAL_BASED_OUTPATIENT_CLINIC_OR_DEPARTMENT_OTHER): Payer: Self-pay | Admitting: Plastic Surgery

## 2020-07-08 ENCOUNTER — Other Ambulatory Visit: Payer: Self-pay

## 2020-07-08 ENCOUNTER — Ambulatory Visit (INDEPENDENT_AMBULATORY_CARE_PROVIDER_SITE_OTHER): Payer: 59 | Admitting: Surgical

## 2020-07-08 ENCOUNTER — Encounter: Payer: Self-pay | Admitting: Surgical

## 2020-07-08 VITALS — HR 64 | Temp 98.3°F

## 2020-07-08 DIAGNOSIS — Z9013 Acquired absence of bilateral breasts and nipples: Secondary | ICD-10-CM

## 2020-07-08 DIAGNOSIS — N651 Disproportion of reconstructed breast: Secondary | ICD-10-CM

## 2020-07-08 NOTE — Progress Notes (Signed)
Patient is a 44 year old female here for follow-up on her bilateral breast reconstruction.  She underwent removal of bilateral tissue expanders and placement of bilateral breast implants on 03/01/2019 with Dr. Ulice Bold.  She subsequently underwent liposuction of abdomen and fat filling of bilateral breasts on 06/27/2020 for improved symmetry and contour.  Today she reports that overall she is doing well.  She has noticed improved contour of her bilateral breasts and is pleased thus far.  She reports continued tenderness of her abdomen, she has been wearing the abdominal binder which has been helpful.  She reports that she is going to start physical therapy in the next 2 weeks or so.  She was planning to start 2 weeks postop, however still having a fair amount of tenderness of her abdomen which she feels may limit her in physical therapy and would like to hold off.  Chaperone present on exam On exam bilateral breast incisions intact, improved symmetry of superior and medial poles of bilateral breasts noted.  She does have a little residual bruising noted along the medial aspect of the left breast.  There is no overt swelling noted.  No fluid collections noted.  No incisional dehiscence noted.  No erythema.  Recommend continue to wear sports bra 24/7 for the next month or so. Recommend continue to wear abdominal binder for the next week. Avoid strenuous activity, can start physical therapy in approximately 2 weeks. Would like to see patient back in 2 to 3 weeks for reevaluation. We discussed that some of the fat grafting may not take and over the next few weeks she may notice some changes of the bilateral breasts.  Recommend calling with any questions or concerns.

## 2020-07-12 ENCOUNTER — Encounter: Payer: 59 | Admitting: Surgical

## 2020-07-26 ENCOUNTER — Encounter: Payer: Self-pay | Admitting: Plastic Surgery

## 2020-07-26 ENCOUNTER — Other Ambulatory Visit: Payer: Self-pay

## 2020-07-26 ENCOUNTER — Ambulatory Visit (INDEPENDENT_AMBULATORY_CARE_PROVIDER_SITE_OTHER): Payer: 59 | Admitting: Plastic Surgery

## 2020-07-26 VITALS — BP 131/75 | HR 74

## 2020-07-26 DIAGNOSIS — N651 Disproportion of reconstructed breast: Secondary | ICD-10-CM

## 2020-07-26 NOTE — Progress Notes (Signed)
   Subjective:    Patient ID: Rita Ballard, female    DOB: [birth year], 45 y.o.   MRN: [id number]  The patient is a 45 year old female here for follow-up on her breast reconstruction.  She underwent bilateral mastectomies in May 2021.  She had exchange of the expanders to implants in August 2021 and then fat filling in December 2021.  Overall she is very pleased with her progress.  There is no sign of infection in the operative site her abdomen is doing well.  Her incisions are healed.  She has good take of the fat so far.  She had a little bit of a cyst on the lower right pubic area.  That seems to have settled down.  She still has some restriction in arm range of motion.  It is worse on the left than the right.  She held off on physical therapy due to the abdominal pain.  She is now going to get back into it.     Review of Systems  Constitutional: Negative.   Eyes: Negative.   Respiratory: Negative.   Cardiovascular: Negative.        Objective:   Physical Exam Vitals and nursing note reviewed.  Constitutional:      Appearance: Normal appearance.  HENT:     Head: Normocephalic and atraumatic.  Cardiovascular:     Rate and Rhythm: Normal rate.     Pulses: Normal pulses.  Abdominal:     General: Abdomen is flat. There is no distension.     Tenderness: There is no abdominal tenderness.  Neurological:     Mental Status: She is alert. Mental status is at baseline.  Psychiatric:        Mood and Affect: Mood normal.        Behavior: Behavior normal.        Assessment & Plan:     ICD-10-CM   1. Breast asymmetry following reconstructive surgery  N65.1     Continue with the physical therapy.  She can go into a spanx.  Continue with the sports bra at night.  Follow-up in 6 months. Pictures were obtained of the patient and placed in the chart with the patient's or guardian's permission.

## 2020-10-01 ENCOUNTER — Encounter: Payer: Self-pay | Admitting: Neurology

## 2020-10-24 ENCOUNTER — Ambulatory Visit (INDEPENDENT_AMBULATORY_CARE_PROVIDER_SITE_OTHER): Payer: 59 | Admitting: Neurology

## 2020-10-24 ENCOUNTER — Other Ambulatory Visit: Payer: Self-pay

## 2020-10-24 ENCOUNTER — Encounter: Payer: Self-pay | Admitting: Neurology

## 2020-10-24 VITALS — BP 122/76 | HR 65 | Ht 68.0 in | Wt 175.6 lb

## 2020-10-24 DIAGNOSIS — R202 Paresthesia of skin: Secondary | ICD-10-CM | POA: Diagnosis not present

## 2020-10-24 NOTE — Progress Notes (Signed)
[facility] Clinic Note - Initial Visit   Date: 10/24/20  Rita Ballard MRN: [id number] DOB: [birth year]   Dear Dr. Cletis Media:  Thank you for your kind referral of Rita Ballard for consultation of bilateral arm weakness and numbness. Although her history is well known to you, please allow [place] to reiterate it for the purpose of our medical record. The patient was accompanied to the clinic by self.   History of Present Illness: Rita Ballard is a 45 y.o. right-handed female with left breast cancer (10/2019) s/p bilateral mastectomy presenting for evaluation of bilateral arm weakness and numbness.    She was diagnosed with left breast cancer in April 2021 and had bilateral mastectomy in May 2021, followed by implants in August.  Since May 2021, she has been having bilateral arm weakness.  Weakness involving the entire arm. She has been getting PT since June 2021 and recently started adding weights.  She also complains of reduced range of motion on the left arm.  She has numbness in the arms which is triggered by raising her arms or at night time.  If she repositions or shakes her hands, the numbness resolved within about 30 seconds.  At night time, it may take a longer for sensation to return.  She also has difficulty opening jars and bottles.  She was dropping objects more frequently during the summer, but this has improved.   She was previously working for [employer] and has not worked since May 2021.     Past Medical History:  Diagnosis Date  . Anemia    iron  . Cancer ([place]) 2021   Ductal left breast -upper left quad  . Complication of anesthesia    slow to wake up with general anesthesia with C/S - no epidural or spinal  . Headache    from breast surgery  . History of heavy vaginal bleeding    Dr Cletis Media - will do hysterectomy after patient recovers from breast ca surgery  . Hx of endometriosis   . Hx of scoliosis    As a child    Past Surgical  History:  Procedure Laterality Date  . BREAST RECONSTRUCTION WITH PLACEMENT OF TISSUE EXPANDER AND FLEX HD (ACELLULAR HYDRATED DERMIS) Bilateral 11/30/2019   Procedure: BREAST RECONSTRUCTION WITH PLACEMENT OF TISSUE EXPANDER AND FLEX HD (ACELLULAR HYDRATED DERMIS);  Surgeon: Wallace Going, DO;  Location: [facility];  Service: Plastics;  Laterality: Bilateral;  . CESAREAN SECTION     x 1  . LIPOSUCTION WITH LIPOFILLING Bilateral 06/27/2020   Procedure: fat filling of both breasts;  Surgeon: Wallace Going, DO;  Location: [facility];  Service: Plastics;  Laterality: Bilateral;  1.5 hours  . MASTECTOMY W/ SENTINEL NODE BIOPSY Bilateral 11/30/2019   Procedure: BILATERAL MASTECTOMY WITH LEFT AXILLARY SENTINEL LYMPH NODE BIOPSY;  Surgeon: Alphonsa Overall, MD;  Location: [facility];  Service: General;  Laterality: Bilateral;  BIL PEC BLOCK  . REMOVAL OF BILATERAL TISSUE EXPANDERS WITH PLACEMENT OF BILATERAL BREAST IMPLANTS Bilateral 02/29/2020   Procedure: REMOVAL OF BILATERAL TISSUE EXPANDERS WITH PLACEMENT OF BILATERAL BREAST IMPLANTS;  Surgeon: Wallace Going, DO;  Location: [facility];  Service: Plastics;  Laterality: Bilateral;  2 hours, please  . ROBOTIC ASSISTED LAPAROSCOPIC HYSTERECTOMY AND SALPINGECTOMY Bilateral 04/25/2020   Procedure: XI ROBOTIC ASSISTED LAPAROSCOPIC HYSTERECTOMY AND SALPINGECTOMY;  Surgeon: Delsa Bern, MD;  Location: [facility];  Service: Gynecology;  Laterality: Bilateral;  . WISDOM TOOTH EXTRACTION  Medications:  Outpatient Encounter Medications as of 10/24/2020  Medication Sig  . Multiple Vitamin (MULTIVITAMIN) capsule Take 1 capsule by mouth daily.  . Omega-3 Fatty Acids (FISH OIL) 1000 MG CPDR Take 1 capsule by mouth daily.  . [DISCONTINUED] acetaminophen (TYLENOL) 500 MG tablet take 2 tablets po every 6 hours for 5 days then prn-pain, first dose at 11 p.m. today  . [DISCONTINUED] CHELATED IRON PO Take 1 tablet by mouth daily.    . [DISCONTINUED] cholecalciferol (VITAMIN D3) 25 MCG (1000 UNIT) tablet Take 1,000 Units by mouth daily.   . [DISCONTINUED] ibuprofen (ADVIL) 600 MG tablet take 1 tablet po pc every 6 hours for 5 days then prn-post operative pain; first dose 11 pm today  . [DISCONTINUED] oxyCODONE (ROXICODONE) 5 MG immediate release tablet take 1 tablet po every 6 hours prn-breakthrough post operative pain.   No facility-administered encounter medications on file as of 10/24/2020.    Allergies: No Known Allergies  Family History: History reviewed. No pertinent family history.  Social History: Social History   Tobacco Use  . Smoking status: Never Smoker  . Smokeless tobacco: Never Used  Vaping Use  . Vaping Use: Never used  Substance Use Topics  . Alcohol use: No  . Drug use: No   Social History   Social History Narrative   Right handed     Vital Signs:  BP 122/76   Pulse 65   Ht 5\' 8"  (1.727 m)   Wt 175 lb 9.6 oz (79.7 kg)   LMP 04/14/2020   SpO2 98%   BMI 26.70 kg/m   Neurological Exam: MENTAL STATUS including orientation to time, place, person, recent and remote memory, attention span and concentration, language, and fund of knowledge is normal.  Speech is not dysarthric.  CRANIAL NERVES: II:  No visual field defects.  III-IV-VI: Pupils equal round and reactive to light.  Normal conjugate, extra-ocular eye movements in all directions of gaze.  No nystagmus.  No ptosis.   V:  Normal facial sensation.    VII:  Normal facial symmetry and movements.   VIII:  Normal hearing and vestibular function.   IX-X:  Normal palatal movement.   XI:  Normal shoulder shrug and head rotation.   XII:  Normal tongue strength and range of motion, no deviation or fasciculation.  MOTOR:  No atrophy, fasciculations or abnormal movements.  No pronator drift.   Upper Extremity:  Right  Left  Deltoid  5/5   5/5   Biceps  5/5   5/5   Triceps  5/5   5/5   Infraspinatus 5/5  5/5  Medial pectoralis 5/5   5/5  Wrist extensors  5/5   5/5   Wrist flexors  5/5   5/5   Finger extensors  5/5   5/5   Finger flexors  5/5   5/5   Dorsal interossei  5/5   5/5   Abductor pollicis  5/5   5/5   Tone (Ashworth scale)  0  0   Lower Extremity:  Right  Left  Hip flexors  5/5   5/5   Hip extensors  5/5   5/5   Adductor 5/5  5/5  Abductor 5/5  5/5  Knee flexors  5/5   5/5   Knee extensors  5/5   5/5   Dorsiflexors  5/5   5/5   Plantarflexors  5/5   5/5   Toe extensors  5/5   5/5   Toe flexors  5/5  5/5   Tone (Ashworth scale)  0  0   MSRs:  Right        Left                  brachioradialis 2+  2+  biceps 2+  2+  triceps 2+  2+  patellar 2+  2+  ankle jerk 2+  2+  Hoffman no  no  plantar response down  down   SENSORY:  Normal and symmetric perception of light touch, pinprick, vibration, and proprioception.  Romberg's sign absent. Tinel's sign is negative at the wrists bilaterally.  COORDINATION/GAIT: Normal finger-to- nose-finger and heel-to-shin.  Intact rapid alternating movements bilaterally. Gait narrow based and stable. Tandem and stressed gait intact.    IMPRESSION: Bilateral arm weakness (subjective) and paresthesias following bilateral mastectomy.  I do not appreciate any changes to suggest brachial plexopathy.  In fact, her neurological exam is entirely normal, which is reassuring. She is very anxious about these symptoms and perseverates about all the possible factors which may have contributed.  I recommend that she return for NCS/EMG of the arms to better characterize the nature of her symptoms.  In the meantime, continue PT.   Further recommendations pending results.    Thank you for allowing me to participate in patient's care.  If I can answer any additional questions, I would be pleased to do so.    Sincerely,    Jennica Tagliaferri K. Posey Pronto, DO

## 2020-10-24 NOTE — Patient Instructions (Addendum)
Nerve testing of the arms.  Do not apply any lotion to your hands or arms on the day of testing.   ELECTROMYOGRAM AND NERVE CONDUCTION STUDIES (EMG/NCS) INSTRUCTIONS  How to Prepare The neurologist conducting the EMG will need to know if you have certain medical conditions. Tell the neurologist and other EMG lab personnel if you: . Have a pacemaker or any other electrical medical device . Take blood-thinning medications . Have hemophilia, a blood-clotting disorder that causes prolonged bleeding Bathing Take a shower or bath shortly before your exam in order to remove oils from your skin. Don't apply lotions or creams before the exam.  What to Expect You'll likely be asked to change into a hospital gown for the procedure and lie down on an examination table. The following explanations can help you understand what will happen during the exam.  . Electrodes. The neurologist or a technician places surface electrodes at various locations on your skin depending on where you're experiencing symptoms. Or the neurologist may insert needle electrodes at different sites depending on your symptoms.  . Sensations. The electrodes will at times transmit a tiny electrical current that you may feel as a twinge or spasm. The needle electrode may cause discomfort or pain that usually ends shortly after the needle is removed. If you are concerned about discomfort or pain, you may want to talk to the neurologist about taking a short break during the exam.  . Instructions. During the needle EMG, the neurologist will assess whether there is any spontaneous electrical activity when the muscle is at rest - activity that isn't present in healthy muscle tissue - and the degree of activity when you slightly contract the muscle.  He or she will give you instructions on resting and contracting a muscle at appropriate times. Depending on what muscles and nerves the neurologist is examining, he or she may ask you to change  positions during the exam.  After your EMG You may experience some temporary, minor bruising where the needle electrode was inserted into your muscle. This bruising should fade within several days. If it persists, contact your primary care doctor.

## 2020-10-30 ENCOUNTER — Ambulatory Visit (INDEPENDENT_AMBULATORY_CARE_PROVIDER_SITE_OTHER): Payer: 59 | Admitting: Neurology

## 2020-10-30 ENCOUNTER — Other Ambulatory Visit: Payer: Self-pay

## 2020-10-30 DIAGNOSIS — R202 Paresthesia of skin: Secondary | ICD-10-CM | POA: Diagnosis not present

## 2020-10-30 DIAGNOSIS — Z0279 Encounter for issue of other medical certificate: Secondary | ICD-10-CM

## 2020-10-30 NOTE — Procedures (Signed)
[facility]  [address]  [place], [place] [address] Tel: [phone] Fax:  [phone] Test Date:  10/30/2020  Patient: Rita Ballard DOB: [birth year] Physician: Narda Amber, DO  Sex: Female Height: 5\' 8"  Ref Phys: Narda Amber, DO  ID#: [id number]   Technician:    Patient Complaints: This is a 45 year-old female referred for evaluation of bilateral arm weakness and numbness.  NCV & EMG Findings: Extensive electrodiagnostic testing of the right upper extremity and additional studies of the left shows: 1. Bilateral median, ulnar, radial, medial and lateral antebrachial cutaneous sensory responses are within normal limits. 2. Bilateral median and ulnar motor responses are within normal limits. 3. There is no evidence of active or chronic motor axon loss changes affecting any of the tested muscles.  Motor unit recruitment and configuration is within normal limits.  Impression: This is a normal study of the upper extremities.  In particular, there is no brachial plexopathy, cervical radiculopathy, myopathy, or sensorimotor neuropathy.   ___________________________ Narda Amber, DO    Nerve Conduction Studies Anti Sensory Summary Table   Stim Site NR Peak (ms) Norm Peak (ms) P-T Amp (V) Norm P-T Amp  Left Lat Ante Brach Cutan Anti Sensory (Lat Forearm)  32C  Lat Biceps    1.6 <2.9 19.8 >14  Right Lat Ante Brach Cutan Anti Sensory (Lat Forearm)  32C  Lat Biceps    1.7 <2.9 21.3 >14  Left Med Ante Brach Cutan Anti Sensory (Med Forearm)  32C  Elbow    2.1  10.1   Right Med Ante Brach Cutan Anti Sensory (Med Forearm)  32C  Elbow    2.1  11.3   Left Median Anti Sensory (2nd Digit)  32C  Wrist    2.7 <3.4 50.7 >20  Right Median Anti Sensory (2nd Digit)  32C  Wrist    2.7 <3.4 51.2 >20  Left Radial Anti Sensory (Base 1st Digit)  32C  Wrist    2.3 <2.7 33.9 >18  Right Radial Anti Sensory (Base 1st Digit)  32C  Wrist    1.8 <2.7 39.6 >18  Left  Ulnar Anti Sensory (5th Digit)  32C  Wrist    2.6 <3.1 51.7 >12  Right Ulnar Anti Sensory (5th Digit)  32C  Wrist    2.4 <3.1 50.6 >12   Motor Summary Table   Stim Site NR Onset (ms) Norm Onset (ms) O-P Amp (mV) Norm O-P Amp Site1 Site2 Delta-0 (ms) Dist (cm) Vel (m/s) Norm Vel (m/s)  Left Median Motor (Abd Poll Brev)  32C  Wrist    2.8 <3.9 8.7 >6 Elbow Wrist 4.6 29.0 63 >50  Elbow    7.4  8.1         Right Median Motor (Abd Poll Brev)  32C  Wrist    2.8 <3.9 9.4 >6 Elbow Wrist 4.3 29.0 67 >50  Elbow    7.1  8.3         Left Ulnar Motor (Abd Dig Minimi)  32C  Wrist    1.8 <3.1 10.7 >7 B Elbow Wrist 3.1 20.0 65 >50  B Elbow    4.9  10.0  A Elbow B Elbow 1.8 10.0 56 >50  A Elbow    6.7  9.5         Right Ulnar Motor (Abd Dig Minimi)  32C  Wrist    2.0 <3.1 10.0 >7 B Elbow Wrist 3.5 22.0 63 >50  B Elbow  5.5  8.7  A Elbow B Elbow 1.6 10.0 63 >50  A Elbow    7.1  8.1          EMG   Side Muscle Ins Act Fibs Psw Fasc Number Recrt Dur Dur. Amp Amp. Poly Poly. Comment  Right 1stDorInt Nml Nml Nml Nml Nml Nml Nml Nml Nml Nml Nml Nml N/A  Right PronatorTeres Nml Nml Nml Nml Nml Nml Nml Nml Nml Nml Nml Nml N/A  Right Biceps Nml Nml Nml Nml Nml Nml Nml Nml Nml Nml Nml Nml N/A  Right Triceps Nml Nml Nml Nml Nml Nml Nml Nml Nml Nml Nml Nml N/A  Right Deltoid Nml Nml Nml Nml Nml Nml Nml Nml Nml Nml Nml Nml N/A  Right Cervical Parasp Low Nml Nml Nml Nml Nml Nml Nml Nml Nml Nml Nml Nml N/A  Left 1stDorInt Nml Nml Nml Nml Nml Nml Nml Nml Nml Nml Nml Nml N/A  Left PronatorTeres Nml Nml Nml Nml Nml Nml Nml Nml Nml Nml Nml Nml N/A  Left Biceps Nml Nml Nml Nml Nml Nml Nml Nml Nml Nml Nml Nml N/A  Left Triceps Nml Nml Nml Nml Nml Nml Nml Nml Nml Nml Nml Nml N/A  Left Deltoid Nml Nml Nml Nml Nml Nml Nml Nml Nml Nml Nml Nml N/A      Waveforms:

## 2020-10-31 ENCOUNTER — Telehealth: Payer: Self-pay

## 2020-10-31 NOTE — Telephone Encounter (Signed)
-----   Message from Alda Berthold, DO sent at 10/30/2020  4:37 PM EDT ----- Please inform patient that her nerve and muscle testing is entirely normal.  I will not be completing her disability forms, as there is nothing from a neurological standpoint which would explain her symptoms.  Neurological exam and testing is normal.  Thanks.

## 2020-10-31 NOTE — Telephone Encounter (Signed)
Called patient and informed her of Normal results and that Dr. Posey Pronto will not be filling out her Disability form. Patient stated "But I already paid for the form." Advised patient that she may come to the office and receive a refund.   Patient had no further questions or concerns.

## 2020-11-13 ENCOUNTER — Telehealth: Payer: Self-pay | Admitting: Plastic Surgery

## 2020-11-13 NOTE — Telephone Encounter (Signed)
Patient still can't lift arms and has no strength after physical therapy. She has met the max number of Physical Therapy visits her insurance will cover and she has not had any progress. Patient would like to know if there are any other options available to help her regain strength and ROM after her surgery. Physical therapist suggested there may be something else happening that needs to be looked at. Please call patient to advise.

## 2020-11-13 NOTE — Telephone Encounter (Signed)
Called and spoke with the patient regarding the message below.  Informed her that I spoke with [facility], and he suggested that she schedule an appointment to come back and be re-evaluated by Dr. Marla Roe.  Patient verbalized understanding and agreed to call back and schedule and appointment.//AB/CMA

## 2020-11-15 ENCOUNTER — Other Ambulatory Visit: Payer: Self-pay | Admitting: Nephrology

## 2020-11-15 DIAGNOSIS — R7989 Other specified abnormal findings of blood chemistry: Secondary | ICD-10-CM

## 2020-11-19 ENCOUNTER — Ambulatory Visit (INDEPENDENT_AMBULATORY_CARE_PROVIDER_SITE_OTHER): Payer: 59 | Admitting: Plastic Surgery

## 2020-11-19 ENCOUNTER — Encounter: Payer: Self-pay | Admitting: Plastic Surgery

## 2020-11-19 ENCOUNTER — Other Ambulatory Visit: Payer: Self-pay

## 2020-11-19 ENCOUNTER — Ambulatory Visit
Admission: RE | Admit: 2020-11-19 | Discharge: 2020-11-19 | Disposition: A | Discharge status: Home / Self Care | Payer: 59 | Source: Ambulatory Visit | Attending: Nephrology | Admitting: Nephrology

## 2020-11-19 DIAGNOSIS — R7989 Other specified abnormal findings of blood chemistry: Secondary | ICD-10-CM

## 2020-11-19 DIAGNOSIS — Z9013 Acquired absence of bilateral breasts and nipples: Secondary | ICD-10-CM | POA: Diagnosis not present

## 2020-11-19 NOTE — Progress Notes (Signed)
   Subjective:    Patient ID: Rita Ballard, female    DOB: 01-25-1976, 45 y.o.   MRN: [id number]  The patient is a 45 year old female here for evaluation of her breast.  She had bilateral mastectomies with reconstruction.  She has silicone implants in place.  She had fat grafting done in December.  She is is not interested in tattooing at this time.  She has been going to physical therapy with a little bit of improvement but still has a decreased ability to raise her arms completely with limitations in abduction.  This is making it difficult for her to get back to work.  There are no lumps or bumps noted and her implants appear to be doing very well.  She is happy with the overall reconstruction.  She is got a lot of tightness in her axilla.  Review of Systems  Constitutional: Positive for activity change. Negative for appetite change.  Eyes: Negative.   Respiratory: Negative for chest tightness and shortness of breath.   Cardiovascular: Negative for leg swelling.  Gastrointestinal: Negative for abdominal distention.  Endocrine: Negative.   Genitourinary: Negative.   Musculoskeletal: Negative.   Neurological: Negative.   Hematological: Negative.   Psychiatric/Behavioral: Negative.        Objective:   Physical Exam Vitals and nursing note reviewed.  Constitutional:      Appearance: Normal appearance.  HENT:     Head: Normocephalic and atraumatic.  Cardiovascular:     Rate and Rhythm: Normal rate.     Pulses: Normal pulses.  Pulmonary:     Effort: Pulmonary effort is normal.  Musculoskeletal:        General: No swelling or deformity.  Skin:    General: Skin is warm.     Capillary Refill: Capillary refill takes less than 2 seconds.     Coloration: Skin is not jaundiced.     Findings: No bruising.  Neurological:     Mental Status: She is alert and oriented to person, place, and time. Mental status is at baseline.  Psychiatric:        Mood and Affect: Mood normal.         Behavior: Behavior normal.        Thought Content: Thought content normal.         Assessment & Plan:     ICD-10-CM   1. Acquired absence of breast and absent nipple, bilateral  Z90.13     Pictures were obtained of the patient and placed in the chart with the patient's or guardian's permission.  We will make a consult to a physiatrist to see if there is anything else we can do to help get her better range of motion.  I like to see her back in a couple of months for follow-up.  The patient is in agreement.

## 2020-12-04 ENCOUNTER — Encounter: Payer: 59 | Admitting: Neurology

## 2020-12-10 ENCOUNTER — Encounter: Payer: Self-pay | Admitting: Physical Medicine and Rehabilitation

## 2020-12-30 ENCOUNTER — Other Ambulatory Visit: Payer: Self-pay

## 2020-12-30 ENCOUNTER — Encounter: Payer: 59 | Attending: Physical Medicine and Rehabilitation | Admitting: Physical Medicine and Rehabilitation

## 2020-12-30 ENCOUNTER — Encounter: Payer: Self-pay | Admitting: Physical Medicine and Rehabilitation

## 2020-12-30 VITALS — BP 121/82 | HR 72 | Temp 98.7°F | Ht 68.0 in | Wt 177.4 lb

## 2020-12-30 DIAGNOSIS — R200 Anesthesia of skin: Secondary | ICD-10-CM | POA: Insufficient documentation

## 2020-12-30 DIAGNOSIS — R252 Cramp and spasm: Secondary | ICD-10-CM | POA: Insufficient documentation

## 2020-12-30 DIAGNOSIS — R202 Paresthesia of skin: Secondary | ICD-10-CM | POA: Diagnosis present

## 2020-12-30 DIAGNOSIS — M25612 Stiffness of left shoulder, not elsewhere classified: Secondary | ICD-10-CM | POA: Diagnosis present

## 2020-12-30 DIAGNOSIS — M25611 Stiffness of right shoulder, not elsewhere classified: Secondary | ICD-10-CM | POA: Insufficient documentation

## 2020-12-30 NOTE — Addendum Note (Signed)
Addended by: Izora Ribas on: 12/30/2020 09:12 AM   Modules accepted: Level of Service

## 2020-12-30 NOTE — Progress Notes (Addendum)
Subjective:    Patient ID: Rita Ballard, female    DOB: 1976/03/27, 45 y.o.   MRN: [id number]  HPI Rita Ballard is a 45 year old woman who presents to establish care for limited range of motion in both arms. She feels the tension builds up in her muscles. She has been unable to work due to this. She followed up with her plastic surgeon who did her reconstruction and was told the capsule was too tight. She has completed physical therapy. It was discussed her that implants could be removed but this may not improve the tightness. Average pain is 1/10. The pain is intermittent. When she has to squeeze her hands and feels a pressure in her arm she feels like the whole body fights this. She has been unable to work due to this- she has to lift bags all day long. Had EMG/NCS that was normal.   Pain Inventory Average Pain 1 Pain Right Now 1 My pain is intermittent  In the last 24 hours, has pain interfered with the following? General activity 5 Relation with others 0 Enjoyment of life 3 What TIME of day is your pain at its worst? varies Sleep (in general) Good  Pain is worse with: some activites Pain improves with:  other Relief from Meds:  no meds  walk without assistance Do you have any goals in this area?  no  disabled: date disabled 11/30/19 I need assistance with the following:  household duties Do you have any goals in this area?  yes  numbness tingling  Any changes since last visit?  no  Rita Hives MD    No family history on file. Social History   Socioeconomic History   Marital status: Married    Spouse name: Not on file   Number of children: Not on file   Years of education: Not on file   Highest education level: Not on file  Occupational History   Not on file  Tobacco Use   Smoking status: Never   Smokeless tobacco: Never  Vaping Use   Vaping Use: Never used  Substance and Sexual Activity   Alcohol use: No   Drug use: No   Sexual activity: Yes     Birth control/protection: Condom  Other Topics Concern   Not on file  Social History Narrative   Right handed    Social Determinants of Health   Financial Resource Strain: Not on file  Food Insecurity: Not on file  Transportation Needs: Not on file  Physical Activity: Not on file  Stress: Not on file  Social Connections: Not on file   Past Surgical History:  Procedure Laterality Date   BREAST RECONSTRUCTION WITH PLACEMENT OF TISSUE EXPANDER AND FLEX HD (ACELLULAR HYDRATED DERMIS) Bilateral 11/30/2019   Procedure: BREAST RECONSTRUCTION WITH PLACEMENT OF TISSUE EXPANDER AND FLEX HD (ACELLULAR HYDRATED DERMIS);  Surgeon: Wallace Going, DO;  Location: [place];  Service: Plastics;  Laterality: Bilateral;   CESAREAN SECTION     x 1   LIPOSUCTION WITH LIPOFILLING Bilateral 06/27/2020   Procedure: fat filling of both breasts;  Surgeon: Wallace Going, DO;  Location: [facility];  Service: Plastics;  Laterality: Bilateral;  1.5 hours   MASTECTOMY W/ SENTINEL NODE BIOPSY Bilateral 11/30/2019   Procedure: BILATERAL MASTECTOMY WITH LEFT AXILLARY SENTINEL LYMPH NODE BIOPSY;  Surgeon: Alphonsa Overall, MD;  Location: [facility];  Service: General;  Laterality: Bilateral;  BIL PEC BLOCK   REMOVAL OF BILATERAL TISSUE EXPANDERS WITH  PLACEMENT OF BILATERAL BREAST IMPLANTS Bilateral 02/29/2020   Procedure: REMOVAL OF BILATERAL TISSUE EXPANDERS WITH PLACEMENT OF BILATERAL BREAST IMPLANTS;  Surgeon: Wallace Going, DO;  Location: [facility];  Service: Plastics;  Laterality: Bilateral;  2 hours, please   ROBOTIC ASSISTED LAPAROSCOPIC HYSTERECTOMY AND SALPINGECTOMY Bilateral 04/25/2020   Procedure: XI ROBOTIC ASSISTED LAPAROSCOPIC HYSTERECTOMY AND SALPINGECTOMY;  Surgeon: Delsa Bern, MD;  Location[facility];  Service: Gynecology;  Laterality: Bilateral;   WISDOM TOOTH EXTRACTION     Past Medical History:  Diagnosis Date   Anemia    iron   Cancer ([place]) 2021    Ductal left breast -upper left quad   Complication of anesthesia    slow to wake up with general anesthesia with C/S - no epidural or spinal   Headache    from breast surgery   History of heavy vaginal bleeding    Dr Cletis Media - will do hysterectomy after patient recovers from breast ca surgery   Hx of endometriosis    Hx of scoliosis    As a child   BP 121/82   Pulse 72   Temp 98.7 F (37.1 C)   Ht 5\' 8"  (1.727 m)   Wt 177 lb 6.4 oz (80.5 kg)   LMP 04/14/2020   SpO2 97%   BMI 26.97 kg/m   Opioid Risk Score:   Fall Risk Score:  `1  Depression screen PHQ 2/9  Depression screen PHQ 2/9 12/30/2020  Decreased Interest 0  Down, Depressed, Hopeless 0  PHQ - 2 Score 0  Altered sleeping 0  Tired, decreased energy 1  Change in appetite 0  Feeling bad or failure about yourself  0  Trouble concentrating 0  Moving slowly or fidgety/restless 0  Suicidal thoughts 0  PHQ-9 Score 1     Review of Systems  Constitutional: Negative.   HENT: Negative.    Eyes: Negative.   Respiratory: Negative.    Cardiovascular: Negative.   Gastrointestinal: Negative.   Endocrine: Negative.   Genitourinary: Negative.   Musculoskeletal:        Shoulder mobility  Skin: Negative.   Allergic/Immunologic: Negative.   Neurological:  Positive for numbness.       Tingling  Hematological: Negative.   Psychiatric/Behavioral: Negative.    All other systems reviewed and are negative.     Objective:   Physical Exam Gen: no distress, normal appearing HEENT: oral mucosa pink and moist, NCAT Cardio: Reg rate Chest: normal effort, normal rate of breathing Abd: soft, non-distended Ext: no edema Psych: pleasant, normal affect Skin: intact Neuro: Alert and oriented x3 Musculoskeletal: Limited range of motion in all planes in bilateral shoulders. Very strong musculature.      Assessment & Plan:  1) Impaired range of motion following bilateral breast reconstruction following mastectomy.  -she felt  very sleepy with valium -has completed PT, continue HEP -discussed Botox vs oral spasticity medications.  -will plan for Botox bilateral pecs 50U each next visit.  -Discussed her work limitations.   2) Numbness bilateral arms: entire arms, intermittent.  -discussed potential etiologies -only when lifting arms -not positional  -has had EMG/NCS that was normal.  -dicussed potential thoracic outlet syndrome from surgical scar tissues -recommended massage, heat to relax muscles.   60 minutes spent in reviewing chart, discussing with patient her restricted range of motion, performing physical examination, reviewing diagnostics that have been performed, risks and benefits of oral and anti-spasticity medications, her work limitations and goal to return to light duty  at first, numbness and tingling in both arms, possible diagnosis of thoracic outlet syndrome, trying heat and massage to help relaz muscles in the interim.

## 2021-01-03 ENCOUNTER — Ambulatory Visit: Payer: 59 | Admitting: Neurology

## 2021-01-24 ENCOUNTER — Ambulatory Visit: Payer: 59 | Admitting: Plastic Surgery

## 2021-02-14 ENCOUNTER — Telehealth: Payer: Self-pay | Admitting: Neurology

## 2021-02-14 NOTE — Telephone Encounter (Signed)
Pt called in stating she received an email from her disability company at work that they send paperwork for Dr. Posey Pronto to fill out, but they had not received anything. She knows Dr. Posey Pronto had refused to fill out paperwork back in April, but if they could fill out the form stating Dr. Posey Pronto is not treating her. She paid the $25 form fee back in April.

## 2021-02-17 NOTE — Telephone Encounter (Signed)
She needs to contact her disability company and let them know who she would like her paperwork to go to.  If she has a specific form that states she is not under my care, she may give this to me to review, but typically this is not needed.

## 2021-02-17 NOTE — Telephone Encounter (Signed)
Called patient and informed her that she will need to contact her disability company and let them know who she would like her paperwork to go to.  If she has a specific form that states she is not under my care, she may give this to Dr.Patel to review.  Patient verbalized understanding and informed me that it has been difficult for her to get a hold of the company. Patient stated that she wasn't sent a form for Dr. Posey Pronto to fill out. Patient asked if Dr. Posey Pronto could just write a letter stating that patient is not under her care.  Informed patient that I would send message to Dr. Posey Pronto to ask and contact her once I hear back.

## 2021-02-17 NOTE — Telephone Encounter (Signed)
Called and spoke to patient and she provided needed information:  Fax#: [phone] Attn: Doloris Hall Claim #: [id number]

## 2021-02-18 NOTE — Telephone Encounter (Signed)
Letter completed, please fax

## 2021-02-18 NOTE — Telephone Encounter (Signed)
Letter has been faxed. Called patient and informed her that letter has been faxed. Patient thanked [place] for our help

## 2021-02-24 ENCOUNTER — Encounter: Payer: Self-pay | Admitting: Physical Medicine and Rehabilitation

## 2021-02-24 ENCOUNTER — Other Ambulatory Visit: Payer: Self-pay

## 2021-02-24 ENCOUNTER — Encounter: Payer: 59 | Attending: Physical Medicine and Rehabilitation | Admitting: Physical Medicine and Rehabilitation

## 2021-02-24 VITALS — BP 115/80 | HR 70 | Ht 68.0 in | Wt 178.0 lb

## 2021-02-24 DIAGNOSIS — R252 Cramp and spasm: Secondary | ICD-10-CM | POA: Insufficient documentation

## 2021-02-24 DIAGNOSIS — Z9013 Acquired absence of bilateral breasts and nipples: Secondary | ICD-10-CM | POA: Insufficient documentation

## 2021-02-24 NOTE — Progress Notes (Signed)
Botox Injection for spasticity using ultrasound guidance  Indication: Severe spasticity which interferes with ADL,mobility and/or  hygiene and is unresponsive to medication management and other conservative care Informed consent was obtained after describing risks and benefits of the procedure with the patient. This includes bleeding, bruising, infection, excessive weakness, or medication side effects. A [employer]S form is on file and signed.  Bilateral pectoralis major injections, 50U each.   All injections were done after obtaining appropriate ultrasound visualization and after negative drawback for blood. The patient tolerated the procedure well. Post procedure instructions were given. A followup appointment was made.

## 2021-03-28 ENCOUNTER — Other Ambulatory Visit: Payer: Self-pay

## 2021-03-28 ENCOUNTER — Ambulatory Visit (INDEPENDENT_AMBULATORY_CARE_PROVIDER_SITE_OTHER): Payer: 59 | Admitting: Plastic Surgery

## 2021-03-28 ENCOUNTER — Encounter: Payer: Self-pay | Admitting: Plastic Surgery

## 2021-03-28 DIAGNOSIS — C50412 Malignant neoplasm of upper-outer quadrant of left female breast: Secondary | ICD-10-CM | POA: Diagnosis not present

## 2021-03-28 DIAGNOSIS — Z9013 Acquired absence of bilateral breasts and nipples: Secondary | ICD-10-CM

## 2021-03-28 DIAGNOSIS — N651 Disproportion of reconstructed breast: Secondary | ICD-10-CM | POA: Diagnosis not present

## 2021-03-28 DIAGNOSIS — Z171 Estrogen receptor negative status [ER-]: Secondary | ICD-10-CM | POA: Diagnosis not present

## 2021-03-28 NOTE — Progress Notes (Signed)
   Subjective:    Patient ID: Rita Ballard, female    DOB: Feb 20, 1976, 45 y.o.   MRN: [id number]  The patient is a 45 yrs old female here for evaluation after bilateral breast reconstruction.  The patient has complained of upper arm weakness and numbness.  It seems to be getting worse with time.  She has been to physical therapy and a physiatrist.  Her range of motion improved but the weakness has been persistent.  Botox was tried but unfortunately the patient feels like it made things worse.  She describes the weakness as constant and the numbness particularly when she gets up in the morning from sleeping.  She says it does not matter how she sleeps in which position she still wakes up with the numbness. There are no concerning lumps or bumps in her breast.   She gives a history of normal nerve studies by the neurologist.     Review of Systems  Constitutional: Negative.   HENT: Negative.    Eyes: Negative.   Respiratory: Negative.    Cardiovascular: Negative.   Gastrointestinal: Negative.   Endocrine: Negative.   Genitourinary: Negative.   Musculoskeletal:        Arm numbness and weakness  Skin:  Negative for color change and wound.  Neurological: Negative.   Hematological: Negative.   Psychiatric/Behavioral: Negative.        Objective:   Physical Exam Nursing note reviewed.  Constitutional:      Appearance: Normal appearance.  HENT:     Head: Normocephalic and atraumatic.  Cardiovascular:     Rate and Rhythm: Normal rate.     Pulses: Normal pulses.  Pulmonary:     Effort: Pulmonary effort is normal.  Abdominal:     General: Abdomen is flat. There is no distension.     Tenderness: There is no abdominal tenderness.  Musculoskeletal:        General: No swelling or deformity.  Skin:    General: Skin is warm.     Capillary Refill: Capillary refill takes less than 2 seconds.     Coloration: Skin is not jaundiced.  Neurological:     Mental Status: She is alert. Mental  status is at baseline.  Psychiatric:        Mood and Affect: Mood normal.        Behavior: Behavior normal.        Thought Content: Thought content normal.          Assessment & Plan:     ICD-10-CM   1. Malignant neoplasm of upper-outer quadrant of left breast in female, estrogen receptor negative ([place])  C50.412    Z17.1     2. Breast asymmetry following reconstructive surgery  N65.1     3. Acquired absence of breast and absent nipple, bilateral  Z90.13        Plan for MRI of C-spine and then televisit with me. We may need to repeat nerve studies but will wait till botox out of system.  Pictures were obtained of the patient and placed in the chart with the patient's or guardian's permission.

## 2021-04-04 ENCOUNTER — Ambulatory Visit: Payer: 59 | Admitting: Physical Medicine and Rehabilitation

## 2021-04-11 NOTE — Progress Notes (Signed)
Subjective:    Patient ID: Rita Ballard, female    DOB: 06-Sep-1975, 45 y.o.   MRN: [id number]  HPI Rita Ballard is a 45 year old woman who presents for follow-up of limited range of motion in both arms after she had surgery for breast cancer.   1) Limited range of motion bilaterally secondary to tight pectoral muscles/post-surgical adhesions.  -She feels the tension builds up in her muscles.  -She has been unable to work due to this.  -She followed up with her plastic surgeon who did her reconstruction and was told the capsule was too tight.  -She has completed physical therapy. It was discussed her that implants could be removed but this may not improve the tightness.  -Average pain is 1/10.  The pain is intermittent.  -When she has to squeeze her hands and feels a pressure in her arm she feels like the whole body fights this.  -She has been unable to work due to this- she has to lift bags all day long.  -Had EMG/NCS that was normal.  -Last visit we tried for her 50U of Botox bilaterally into the pec muscles and this made her worse- she noticed significant decrease of strength -she has follow-up with Dr. Marla Roe and there is plan for repeat MRI today to assess her nerves.  Numbness and tingling has gone.  -Myself and plastic surgery have filled out her disability forms- she is getting paid.  -she continued to do her daily range of motion  Pain Inventory Average Pain 0 Pain Right Now 0 My pain is  no pain  In the last 24 hours, has pain interfered with the following? General activity 0 Relation with others 0 Enjoyment of life 0 What TIME of day is your pain at its worst?no pain Sleep (in general) Good  Pain is worse with: some activites Pain improves with:  other Relief from Meds:  no meds    No family history on file. Social History   Socioeconomic History   Marital status: Married    Spouse name: Not on file   Number of children: Not on file   Years of  education: Not on file   Highest education level: Not on file  Occupational History   Not on file  Tobacco Use   Smoking status: Never   Smokeless tobacco: Never  Vaping Use   Vaping Use: Never used  Substance and Sexual Activity   Alcohol use: No   Drug use: No   Sexual activity: Yes    Birth control/protection: Condom  Other Topics Concern   Not on file  Social History Narrative   Right handed    Social Determinants of Health   Financial Resource Strain: Not on file  Food Insecurity: Not on file  Transportation Needs: Not on file  Physical Activity: Not on file  Stress: Not on file  Social Connections: Not on file   Past Surgical History:  Procedure Laterality Date   BREAST RECONSTRUCTION WITH PLACEMENT OF TISSUE EXPANDER AND FLEX HD (ACELLULAR HYDRATED DERMIS) Bilateral 11/30/2019   Procedure: BREAST RECONSTRUCTION WITH PLACEMENT OF TISSUE EXPANDER AND FLEX HD (ACELLULAR HYDRATED DERMIS);  Surgeon: Wallace Going, DO;  Location: [facility];  Service: Plastics;  Laterality: Bilateral;   CESAREAN SECTION     x 1   LIPOSUCTION WITH LIPOFILLING Bilateral 06/27/2020   Procedure: fat filling of both breasts;  Surgeon: Wallace Going, DO;  Location: [facility];  Service: Plastics;  Laterality:  Bilateral;  1.5 hours   MASTECTOMY W/ SENTINEL NODE BIOPSY Bilateral 11/30/2019   Procedure: BILATERAL MASTECTOMY WITH LEFT AXILLARY SENTINEL LYMPH NODE BIOPSY;  Surgeon: Alphonsa Overall, MD;  Location: [facility];  Service: General;  Laterality: Bilateral;  BIL PEC BLOCK   REMOVAL OF BILATERAL TISSUE EXPANDERS WITH PLACEMENT OF BILATERAL BREAST IMPLANTS Bilateral 02/29/2020   Procedure: REMOVAL OF BILATERAL TISSUE EXPANDERS WITH PLACEMENT OF BILATERAL BREAST IMPLANTS;  Surgeon: Wallace Going, DO;  Location: [facility];  Service: Plastics;  Laterality: Bilateral;  2 hours, please   ROBOTIC ASSISTED LAPAROSCOPIC HYSTERECTOMY AND SALPINGECTOMY Bilateral  04/25/2020   Procedure: XI ROBOTIC ASSISTED LAPAROSCOPIC HYSTERECTOMY AND SALPINGECTOMY;  Surgeon: Delsa Bern, MD;  [facility];  Service: Gynecology;  Laterality: Bilateral;   WISDOM TOOTH EXTRACTION     Past Medical History:  Diagnosis Date   Anemia    iron   Cancer ([place]) 2021   Ductal left breast -upper left quad   Complication of anesthesia    slow to wake up with general anesthesia with C/S - no epidural or spinal   Headache    from breast surgery   History of heavy vaginal bleeding    Dr Cletis Media - will do hysterectomy after patient recovers from breast ca surgery   Hx of endometriosis    Hx of scoliosis    As a child   LMP 04/14/2020   Opioid Risk Score:   Fall Risk Score:  `1  Depression screen PHQ 2/9  Depression screen PHQ 2/9 12/30/2020  Decreased Interest 0  Down, Depressed, Hopeless 0  PHQ - 2 Score 0  Altered sleeping 0  Tired, decreased energy 1  Change in appetite 0  Feeling bad or failure about yourself  0  Trouble concentrating 0  Moving slowly or fidgety/restless 0  Suicidal thoughts 0  PHQ-9 Score 1     Review of Systems  Constitutional: Negative.   HENT: Negative.    Eyes: Negative.   Respiratory: Negative.    Cardiovascular: Negative.   Gastrointestinal: Negative.   Endocrine: Negative.   Genitourinary: Negative.   Musculoskeletal: Negative.   Skin: Negative.   Allergic/Immunologic: Negative.   Neurological: Negative.   Hematological: Negative.   Psychiatric/Behavioral: Negative.    All other systems reviewed and are negative.     Objective:   Physical Exam Gen: no distress, normal appearing HEENT: oral mucosa pink and moist, NCAT Cardio: Reg rate Chest: normal effort, normal rate of breathing Abd: soft, non-distended Ext: no edema Psych: pleasant, normal affect Skin: intact Neuro: Alert and oriented x3 Musculoskeletal: Limited range of motion in all planes in bilateral shoulders- range of motion is worse than last  time. Very strong musculature.      Assessment & Plan:  1) Impaired range of motion following bilateral breast reconstruction following mastectomy.  -she felt very sleepy with valium -has completed PT, continue HEP. Discussed stretch lab.  -will not repeat Botox as this made her weaker.  -Discussed her work limitations.  -continue to follow-up with Dr. Marla Roe.  -will continue to fill out disability forms, discussed alternative jobs at work.   2) Numbness bilateral arms: entire arms, intermittent.  -discussed potential etiologies -only when lifting arms -not positional  -has had EMG/NCS that was normal.  -dicussed potential thoracic outlet syndrome from surgical scar tissues -recommended massage, heat to relax muscles.  -the Botox did help with this.

## 2021-04-15 ENCOUNTER — Other Ambulatory Visit: Payer: Self-pay

## 2021-04-15 ENCOUNTER — Encounter: Payer: Self-pay | Admitting: Physical Medicine and Rehabilitation

## 2021-04-15 ENCOUNTER — Encounter: Payer: 59 | Attending: Physical Medicine and Rehabilitation | Admitting: Physical Medicine and Rehabilitation

## 2021-04-15 ENCOUNTER — Ambulatory Visit (HOSPITAL_COMMUNITY)
Admission: RE | Admit: 2021-04-15 | Discharge: 2021-04-15 | Disposition: A | Discharge status: Home / Self Care | Payer: 59 | Source: Ambulatory Visit | Attending: Plastic Surgery | Admitting: Plastic Surgery

## 2021-04-15 VITALS — BP 119/74 | HR 83 | Temp 98.5°F | Ht 68.0 in | Wt 182.4 lb

## 2021-04-15 DIAGNOSIS — R202 Paresthesia of skin: Secondary | ICD-10-CM | POA: Diagnosis not present

## 2021-04-15 DIAGNOSIS — M25612 Stiffness of left shoulder, not elsewhere classified: Secondary | ICD-10-CM | POA: Diagnosis not present

## 2021-04-15 DIAGNOSIS — Z171 Estrogen receptor negative status [ER-]: Secondary | ICD-10-CM | POA: Insufficient documentation

## 2021-04-15 DIAGNOSIS — R200 Anesthesia of skin: Secondary | ICD-10-CM | POA: Diagnosis not present

## 2021-04-15 DIAGNOSIS — N651 Disproportion of reconstructed breast: Secondary | ICD-10-CM | POA: Insufficient documentation

## 2021-04-15 DIAGNOSIS — M25611 Stiffness of right shoulder, not elsewhere classified: Secondary | ICD-10-CM

## 2021-04-15 DIAGNOSIS — Z9013 Acquired absence of bilateral breasts and nipples: Secondary | ICD-10-CM | POA: Diagnosis present

## 2021-04-15 DIAGNOSIS — C50412 Malignant neoplasm of upper-outer quadrant of left female breast: Secondary | ICD-10-CM | POA: Insufficient documentation

## 2021-04-15 NOTE — Patient Instructions (Addendum)
STRETCH LAB  Cellular/regenerative therapies.

## 2021-04-22 ENCOUNTER — Telehealth (INDEPENDENT_AMBULATORY_CARE_PROVIDER_SITE_OTHER): Payer: 59 | Admitting: Plastic Surgery

## 2021-04-22 ENCOUNTER — Encounter: Payer: Self-pay | Admitting: Plastic Surgery

## 2021-04-22 ENCOUNTER — Other Ambulatory Visit: Payer: Self-pay

## 2021-04-22 DIAGNOSIS — Z9013 Acquired absence of bilateral breasts and nipples: Secondary | ICD-10-CM

## 2021-04-22 DIAGNOSIS — N651 Disproportion of reconstructed breast: Secondary | ICD-10-CM

## 2021-04-22 NOTE — Progress Notes (Signed)
   Subjective:    Patient ID: Rita Ballard, female    DOB: 11-23-1975, 45 y.o.   MRN: [id number]  The patient is a 68 yrs female joining me by televisit.  She has undergone bilateral mastectomies with reconstruction.  She did well during that period.  Over the last several months she has had some issues with arm numbness and weakness.  We have tried physical therapy we also have tried range of motion exercises.  It does not seem to be getting better.  The physiatrist did Botox injection which made it much worse.  She had an MRI which shows some concerns in the C3 and C6 area.  From a breast standpoint she is doing very well and very pleased with her results.     Review of Systems  Constitutional: Negative.   HENT: Negative.    Eyes: Negative.   Respiratory: Negative.    Cardiovascular: Negative.   Gastrointestinal: Negative.   Endocrine: Negative.   Genitourinary: Negative.   Skin: Negative.   Hematological: Negative.   Psychiatric/Behavioral: Negative.        Objective:   Physical Exam      Assessment & Plan:     ICD-10-CM   1. Acquired absence of breast and absent nipple, bilateral  Z90.13     2. Breast asymmetry following reconstructive surgery  N65.1       I connected with  Oda A Iman on 04/22/21 by a video enabled telemedicine application and verified that I am speaking with the correct person using two identifiers.  The patient was at home and I was at the office.  We spent 20 minutes in the following manner: Review of chart, review of MRI, discussion of options and documentation.   I discussed the limitations of evaluation and management by telemedicine. The patient expressed understanding and agreed to proceed.  I would like the patient to see Dr. Ronnald Ramp in neurosurgery for an evaluation and consultation.  The patient is in agreement.  I would like to talk with her once she has seen him.

## 2021-05-16 ENCOUNTER — Telehealth (INDEPENDENT_AMBULATORY_CARE_PROVIDER_SITE_OTHER): Payer: 59 | Admitting: Plastic Surgery

## 2021-05-16 ENCOUNTER — Other Ambulatory Visit: Payer: Self-pay

## 2021-05-16 DIAGNOSIS — N651 Disproportion of reconstructed breast: Secondary | ICD-10-CM | POA: Diagnosis not present

## 2021-05-16 DIAGNOSIS — Z171 Estrogen receptor negative status [ER-]: Secondary | ICD-10-CM | POA: Diagnosis not present

## 2021-05-16 DIAGNOSIS — C50412 Malignant neoplasm of upper-outer quadrant of left female breast: Secondary | ICD-10-CM

## 2021-05-16 DIAGNOSIS — Z9013 Acquired absence of bilateral breasts and nipples: Secondary | ICD-10-CM | POA: Diagnosis not present

## 2021-05-16 NOTE — Progress Notes (Signed)
   Subjective:    Patient ID: Rita Ballard, female    DOB: Oct 11, 1975, 45 y.o.   MRN: [id number]  HPI  The patient is a 45 year old female joining me by telephone due to a glitch in the computer.  We are discussing her shoulder at this time.  She had bilateral mastectomies with reconstruction.  Her mastectomies were May 2021 her exchange was August 2021.  At that time she had [place] smooth round ultra whole high profile 400 cc implants placed bilaterally.  She then had Lipo filling of both breasts in December 2021.  She has complained of ongoing upper arm weakness.  The numbness and range of motion has some with physical therapy and physiatry.  But she still has the weakness which is inhibiting her from getting back to work where she works at [employer].  We tried a neurosurgery but there did not seem to be any issues with the spine.  Review of Systems  Constitutional:  Positive for activity change. Negative for appetite change.  Eyes: Negative.   Respiratory: Negative.    Cardiovascular: Negative.   Gastrointestinal: Negative.   Endocrine: Negative.   Genitourinary: Negative.   Musculoskeletal: Negative.   Skin: Negative.   Psychiatric/Behavioral: Negative.        Objective:   Physical Exam      Assessment & Plan:     ICD-10-CM   1. Malignant neoplasm of upper-outer quadrant of left breast in female, estrogen receptor negative ([place])  C50.412    Z17.1     2. Breast asymmetry following reconstructive surgery  N65.1     3. Acquired absence of breast and absent nipple, bilateral  Z90.13        I connected with  Leane A Vancuren on 05/16/21 by phone due to the computer not working.  Verified that I am speaking with the correct person using two identifiers.  The patient was in [place] I was at my office.  I spent 10 minutes in the following manner: Review of chart, discussion with patient, dictation and referral to orthopedist.   I discussed the limitations of  evaluation and management by telemedicine. The patient expressed understanding and agreed to proceed.  We will plan on an orthopedic referral to Dr. Ninfa Linden for evaluation of her shoulder.  I will plan to talk to her after he assesses her.

## 2021-05-29 ENCOUNTER — Ambulatory Visit: Payer: 59 | Admitting: Physical Medicine and Rehabilitation

## 2021-06-02 ENCOUNTER — Ambulatory Visit (INDEPENDENT_AMBULATORY_CARE_PROVIDER_SITE_OTHER): Payer: 59 | Admitting: Orthopaedic Surgery

## 2021-06-02 ENCOUNTER — Other Ambulatory Visit: Payer: Self-pay

## 2021-06-02 DIAGNOSIS — M25512 Pain in left shoulder: Secondary | ICD-10-CM | POA: Diagnosis not present

## 2021-06-02 DIAGNOSIS — G8929 Other chronic pain: Secondary | ICD-10-CM | POA: Diagnosis not present

## 2021-06-02 NOTE — Progress Notes (Signed)
Office Visit Note   Patient: Rita Ballard           Date of Birth: 1975-11-12           MRN: [id number] Visit Date: 06/02/2021              Requested by: Wallace Going, DO [place],  [place] [address] PCP: Delsa Bern, MD   Assessment & Plan: Visit Diagnoses:  1. Chronic left shoulder pain     Plan: Before we send her directly back to work since she has to lift a lot of heavy objects, we will send her for a few physical therapy visits for work conditioning.  She agrees with this as well.  I gave her prescription for [facility] physical therapy and we will see her in follow-up in order to release her to work by the first of the year.  Follow-Up Instructions: Return in about 5 weeks (around 07/07/2021).   Orders:  No orders of the defined types were placed in this encounter.  No orders of the defined types were placed in this encounter.     Procedures: No procedures performed   Clinical Data: No additional findings.   Subjective: Chief Complaint  Patient presents with   Left Shoulder - Pain   Right Shoulder - Pain  The patient comes in today as a referral from Dr. Marla Roe to evaluate left frozen shoulder syndrome following surgery a year ago.  She had a mastectomy and implants and was having significant muscle weakness and tightness in both shoulders with the left worse than right.  She had gone through extensive physical therapy.  She was sent to me to see what other treatment options that were including injections.  However, she now reports that as of last week she has been able to really move her shoulders fully and has no issues with that at all.  She does work in [employer] and it sounds like it has to lift a lot of heavy things.  She wants to be able to resume that type of work but needs to be checked out to make sure that she can.  She reports that her motion now is full with her shoulders she does not really have any pain  with her shoulders.  HPI  Review of Systems She currently denies a headache, chest pain, shortness of breath, fever, chills, nausea, vomiting  Objective: Vital Signs: LMP 04/14/2020   Physical Exam She is alert and orient x3 and in no acute distress Ortho Exam Examination of both shoulder shows full and fluid range of motion.  She has 5 out of 5 strength in all muscle groups of her bilateral upper extremities. Specialty Comments:  No specialty comments available.  Imaging: No results found.   PMFS History: Patient Active Problem List   Diagnosis Date Noted   Breast asymmetry following reconstructive surgery 06/04/2020   Menorrhagia 04/25/2020   Acquired absence of breast and absent nipple, bilateral 01/12/2020   Breast cancer ([place]) 11/30/2019   Genetic testing 11/03/2019   Malignant neoplasm of upper-outer quadrant of left breast in female, estrogen receptor negative ([place]) 10/25/2019   SVD (spontaneous vaginal delivery) 01/03/2015   First degree perineal laceration during delivery 01/03/2015   Active labor at term 01/02/2015   Past Medical History:  Diagnosis Date   Anemia    iron   Cancer ([place]) 2021   Ductal left breast -upper left quad   Complication of  anesthesia    slow to wake up with general anesthesia with C/S - no epidural or spinal   Headache    from breast surgery   History of heavy vaginal bleeding    Dr Cletis Media - will do hysterectomy after patient recovers from breast ca surgery   Hx of endometriosis    Hx of scoliosis    As a child    No family history on file.  Past Surgical History:  Procedure Laterality Date   BREAST RECONSTRUCTION WITH PLACEMENT OF TISSUE EXPANDER AND FLEX HD (ACELLULAR HYDRATED DERMIS) Bilateral 11/30/2019   Procedure: BREAST RECONSTRUCTION WITH PLACEMENT OF TISSUE EXPANDER AND FLEX HD (ACELLULAR HYDRATED DERMIS);  Surgeon: Wallace Going, DO;  Location: [facility];  Service: Plastics;  Laterality: Bilateral;   CESAREAN SECTION      x 1   LIPOSUCTION WITH LIPOFILLING Bilateral 06/27/2020   Procedure: fat filling of both breasts;  Surgeon: Wallace Going, DO;  Location: [facility];  Service: Plastics;  Laterality: Bilateral;  1.5 hours   MASTECTOMY W/ SENTINEL NODE BIOPSY Bilateral 11/30/2019   Procedure: BILATERAL MASTECTOMY WITH LEFT AXILLARY SENTINEL LYMPH NODE BIOPSY;  Surgeon: Alphonsa Overall, MD;  Location: [facility];  Service: General;  Laterality: Bilateral;  BIL PEC BLOCK   REMOVAL OF BILATERAL TISSUE EXPANDERS WITH PLACEMENT OF BILATERAL BREAST IMPLANTS Bilateral 02/29/2020   Procedure: REMOVAL OF BILATERAL TISSUE EXPANDERS WITH PLACEMENT OF BILATERAL BREAST IMPLANTS;  Surgeon: Wallace Going, DO;  Location: [place] [place];  Service: Plastics;  Laterality: Bilateral;  2 hours, please   ROBOTIC ASSISTED LAPAROSCOPIC HYSTERECTOMY AND SALPINGECTOMY Bilateral 04/25/2020   Procedure: XI ROBOTIC ASSISTED LAPAROSCOPIC HYSTERECTOMY AND SALPINGECTOMY;  Surgeon: Delsa Bern, MD;  Location: [facility];  Service: Gynecology;  Laterality: Bilateral;   WISDOM TOOTH EXTRACTION     Social History   Occupational History   Not on file  Tobacco Use   Smoking status: Never   Smokeless tobacco: Never  Vaping Use   Vaping Use: Never used  Substance and Sexual Activity   Alcohol use: No   Drug use: No   Sexual activity: Yes    Birth control/protection: Condom

## 2021-07-09 ENCOUNTER — Ambulatory Visit: Payer: 59 | Admitting: Orthopaedic Surgery

## 2021-07-15 ENCOUNTER — Other Ambulatory Visit: Payer: Self-pay

## 2021-07-15 ENCOUNTER — Ambulatory Visit (INDEPENDENT_AMBULATORY_CARE_PROVIDER_SITE_OTHER): Payer: 59 | Admitting: Plastic Surgery

## 2021-07-15 DIAGNOSIS — Z171 Estrogen receptor negative status [ER-]: Secondary | ICD-10-CM | POA: Diagnosis not present

## 2021-07-15 DIAGNOSIS — Z9013 Acquired absence of bilateral breasts and nipples: Secondary | ICD-10-CM | POA: Diagnosis not present

## 2021-07-15 DIAGNOSIS — C50412 Malignant neoplasm of upper-outer quadrant of left female breast: Secondary | ICD-10-CM | POA: Diagnosis not present

## 2021-07-15 NOTE — Progress Notes (Signed)
° °  Subjective:    Patient ID: Rita Ballard, female    DOB: 07-26-1975, 46 y.o.   MRN: [id number]  The patient is a 46 year old female here for follow-up on her breast reconstruction.  She had bilateral mastectomies in 2021 and has undergone placement of implants with fat grafting.  Overall she is happy with her results and looks really good.  She is holding off on nipple areola tattooing.  She has had some challenges with range of motion.  She states that over the last month this has improved dramatically.  She is scheduled to see an occupational therapist this week.  She would like to return to work but wants to be sure she can do the job.  On exam she has excellent range of motion and shows remarkable signs of improvement.     Review of Systems  Constitutional:  Positive for activity change.  Eyes: Negative.   Respiratory: Negative.    Cardiovascular: Negative.   Gastrointestinal: Negative.   Endocrine: Negative.   Genitourinary: Negative.   Hematological: Negative.       Objective:   Physical Exam Constitutional:      Appearance: Normal appearance.  Neurological:     Mental Status: She is alert and oriented to person, place, and time.  Psychiatric:        Mood and Affect: Mood normal.        Behavior: Behavior normal.        Thought Content: Thought content normal.        Judgment: Judgment normal.       Assessment & Plan:     ICD-10-CM   1. Malignant neoplasm of upper-outer quadrant of left breast in female, estrogen receptor negative ([place])  C50.412    Z17.1     2. Acquired absence of breast and absent nipple, bilateral  Z90.13        We will plan to do a telemetry visit after her occupational therapy.  Then she can let us know what she wants to do about going back to work. We will also get her scheduled for her 1 year follow-up and we will plan to see her at 1 year.

## 2021-07-17 ENCOUNTER — Encounter: Payer: Self-pay | Admitting: Orthopaedic Surgery

## 2021-07-17 ENCOUNTER — Other Ambulatory Visit: Payer: Self-pay

## 2021-07-17 ENCOUNTER — Ambulatory Visit (INDEPENDENT_AMBULATORY_CARE_PROVIDER_SITE_OTHER): Payer: 59 | Admitting: Orthopaedic Surgery

## 2021-07-17 DIAGNOSIS — G8929 Other chronic pain: Secondary | ICD-10-CM

## 2021-07-17 DIAGNOSIS — M25512 Pain in left shoulder: Secondary | ICD-10-CM | POA: Diagnosis not present

## 2021-07-17 NOTE — Progress Notes (Signed)
The patient is following up as it relates to her frozen left shoulder.  This was a long process for her in terms of left shoulder stiffness and pain after breast cancer surgery and reconstruction.  She is now in physical therapy but only has had her first session this week.  They are going to schedule her for 4 to 6 weeks of therapy to get the best function out of her shoulders as possible.  She was certainly sore this morning after having intensive therapy for a few hours.  Her shoulder motion on the left side continues to significantly improved and at this point is almost full.  There is still some weakness in the shoulder due to atrophy from her previous frozen shoulder for a long time.  I agree with the aggressive physical therapy but going slow for the next 4 to 6 weeks in order to not aggravate her shoulder but also to give the best function of the shoulder as possible.  We will have her out of work for the next 6 weeks and hopefully at that next visit we will be able to release her to work after she has completed a course of therapy.

## 2021-07-18 ENCOUNTER — Ambulatory Visit (INDEPENDENT_AMBULATORY_CARE_PROVIDER_SITE_OTHER): Payer: 59 | Admitting: Plastic Surgery

## 2021-07-18 DIAGNOSIS — C50412 Malignant neoplasm of upper-outer quadrant of left female breast: Secondary | ICD-10-CM

## 2021-07-18 DIAGNOSIS — Z171 Estrogen receptor negative status [ER-]: Secondary | ICD-10-CM

## 2021-07-18 NOTE — Progress Notes (Signed)
We talked about her work forms.  She is likely going to have the PT fill out the forms.

## 2021-07-22 ENCOUNTER — Telehealth: Payer: Self-pay | Admitting: Orthopaedic Surgery

## 2021-07-22 NOTE — Telephone Encounter (Signed)
[place] forms received. To [employer]. 

## 2021-07-23 ENCOUNTER — Telehealth: Payer: Self-pay | Admitting: Orthopaedic Surgery

## 2021-07-23 NOTE — Telephone Encounter (Signed)
Pt submitted medical release form, and $25.00 cash payment to [employer]. Pt stated she spoke to [employer] Rep and forms was faxed from [place]. Accepted 07/23/21

## 2021-09-01 ENCOUNTER — Ambulatory Visit (INDEPENDENT_AMBULATORY_CARE_PROVIDER_SITE_OTHER): Payer: 59 | Admitting: Orthopaedic Surgery

## 2021-09-01 ENCOUNTER — Encounter: Payer: Self-pay | Admitting: Orthopaedic Surgery

## 2021-09-01 ENCOUNTER — Other Ambulatory Visit: Payer: Self-pay

## 2021-09-01 DIAGNOSIS — G8929 Other chronic pain: Secondary | ICD-10-CM

## 2021-09-01 DIAGNOSIS — M25512 Pain in left shoulder: Secondary | ICD-10-CM

## 2021-09-01 NOTE — Progress Notes (Signed)
The patient comes in today for follow-up as it relates to her left shoulder.  She is slowly getting better she feels and notes from physical therapy also correlate with her making progress with shoulder range of motion.  She says that her arm does not go numb anymore.  She is still having problems with conditioning and getting stronger for being able to perform the type of work that the demand of her job requires.  Range of motion of both shoulders is almost entirely full and she has much better strength.  There is no evidence of frozen shoulder at this standpoint.  I did review all the notes and physical therapy.  They are working on work conditioning and feel like that having her come twice a week for the next 4 to 6 weeks we will optimize her functioning of both shoulders and will allow her to eventually hopefully participate in full work duties.  We will reevaluate her in 4 weeks.

## 2021-09-29 ENCOUNTER — Ambulatory Visit: Payer: 59 | Admitting: Orthopaedic Surgery

## 2021-10-06 ENCOUNTER — Ambulatory Visit (INDEPENDENT_AMBULATORY_CARE_PROVIDER_SITE_OTHER): Payer: 59 | Admitting: Orthopaedic Surgery

## 2021-10-06 ENCOUNTER — Encounter: Payer: Self-pay | Admitting: Orthopaedic Surgery

## 2021-10-06 DIAGNOSIS — M25512 Pain in left shoulder: Secondary | ICD-10-CM | POA: Diagnosis not present

## 2021-10-06 DIAGNOSIS — G8929 Other chronic pain: Secondary | ICD-10-CM

## 2021-10-06 NOTE — Progress Notes (Signed)
The patient continues to follow-up for her left shoulder.  She is making progress with physical therapy.  Her job requires her to be able to lift frequently 50 pounds and occasionally 75 pounds.  She is only able to lift up to 40 pounds for now according to therapy notes and more frequently 35 pounds.  Her issue started with her shoulders with arthrofibrosis following breast cancer surgery. ? ?Her shoulders are moving much better overall.  She does report occasional numbness in her arms but overall is making good progress.  I was able to review the therapy notes as well. ? ?We still cannot release her to full work duties and there is really no light duty for her work as a relates to the job that she does.  She has to be able to lift frequently 50 pounds and occasionally 75 pounds which she cannot do right now in order to participate in her regular job.  She will continue physical therapy and we will reevaluate her in 5 weeks.  She agrees with this treatment plan. ?

## 2021-10-31 IMAGING — MR MR CERVICAL SPINE W/O CM
4 of 5 series · 19 of 48 positions shown · non-contrast
Comparison: None.

CLINICAL DATA: Breast cancer surveillance. Arm numbness and
weakness

EXAM:
MRI CERVICAL SPINE WITHOUT CONTRAST
TECHNIQUE: Multiplanar, multisequence MR imaging of the cervical spine was
performed. No intravenous contrast was administered.

[Series 2: T2 · sagittal · 3.0mm · 0.35mm/px · 5 of 18 slices shown (1 of 2)]
[im 1/18]
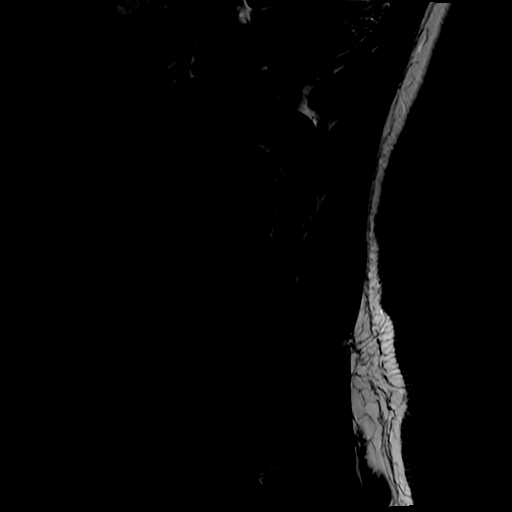
[im 5/18]
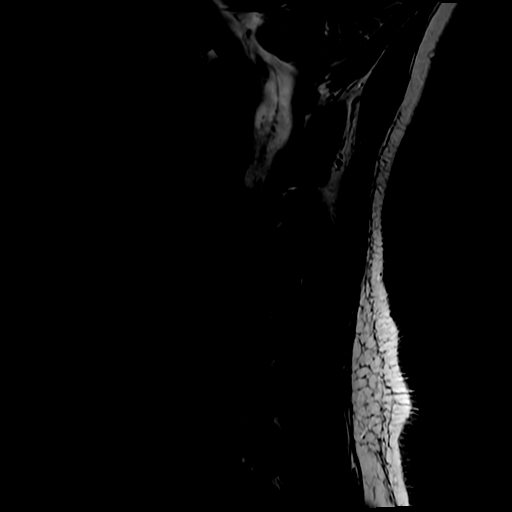
[im 9/18]
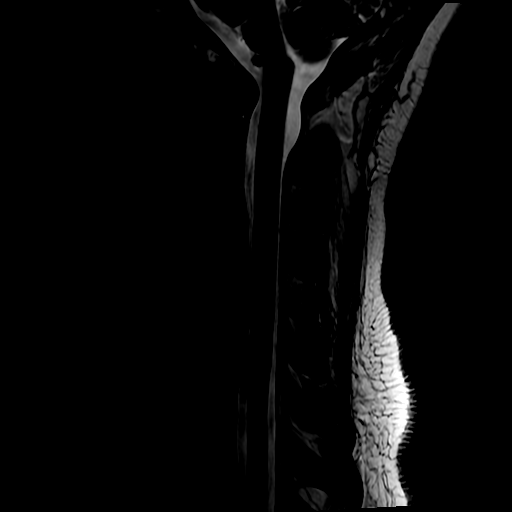
[im 13/18]
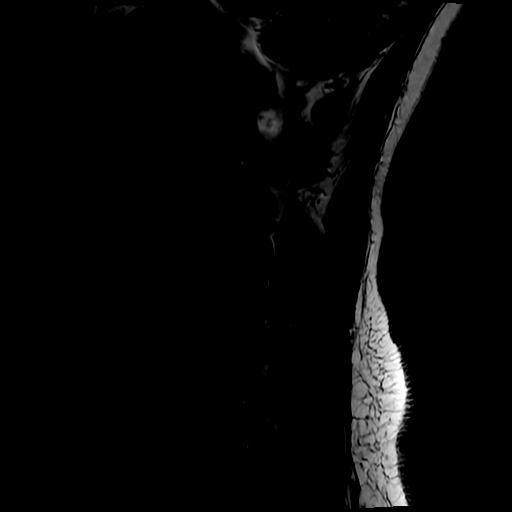
[im 18/18]
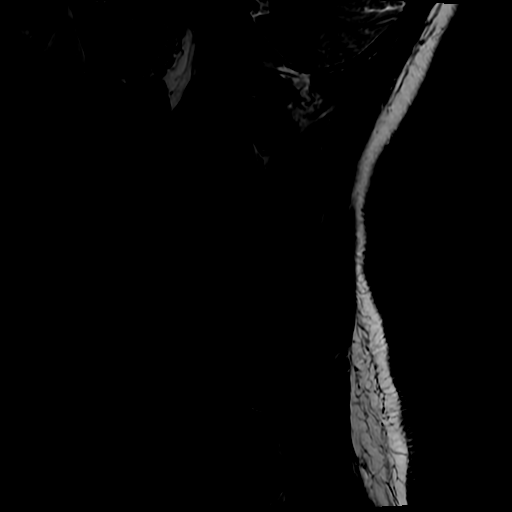

[Series 3: FLAIR · sagittal · 3.0mm · 0.35mm/px · 3 of 18 slices shown]
[im 1/18]
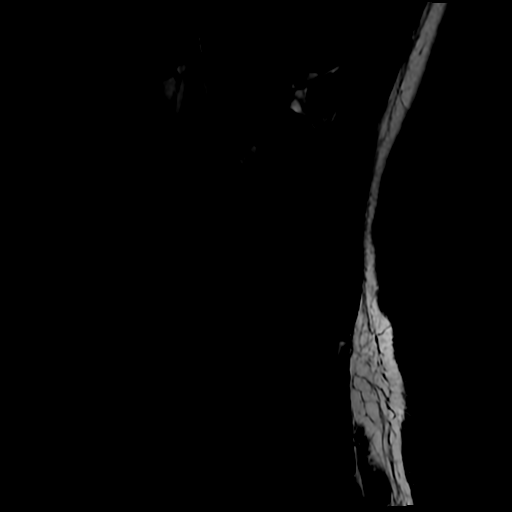
[im 12/18]
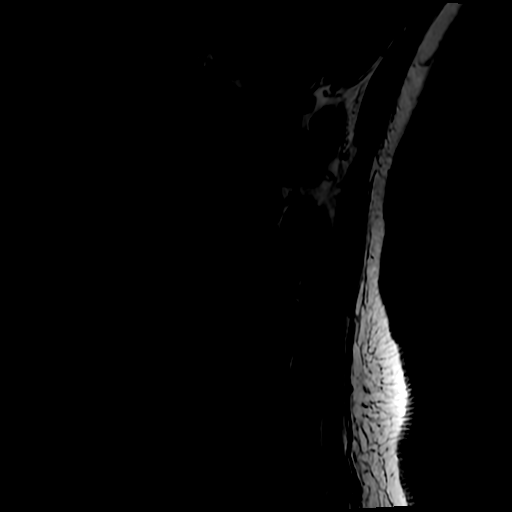
[im 18/18]
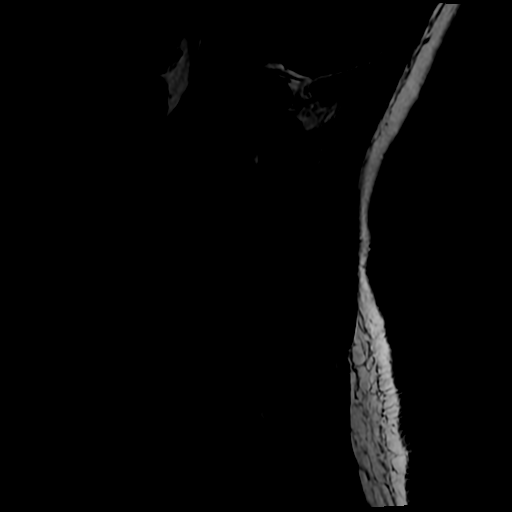

[Series 4: STIR · sagittal · 3.0mm · 0.35mm/px · 3 of 18 slices shown]
[im 1/18]
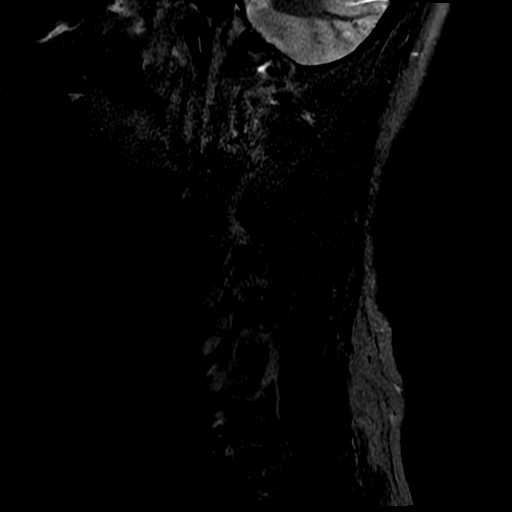
[im 12/18]
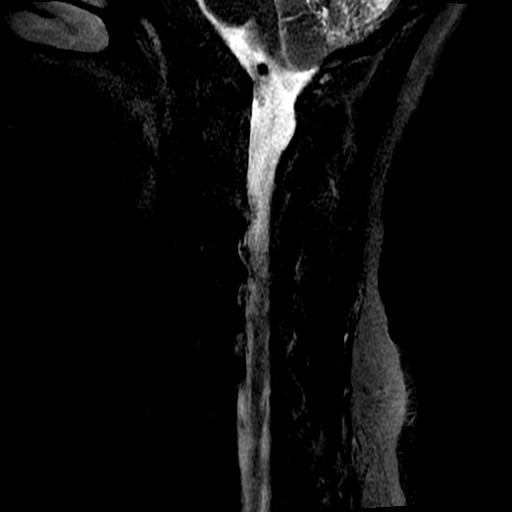
[im 18/18]
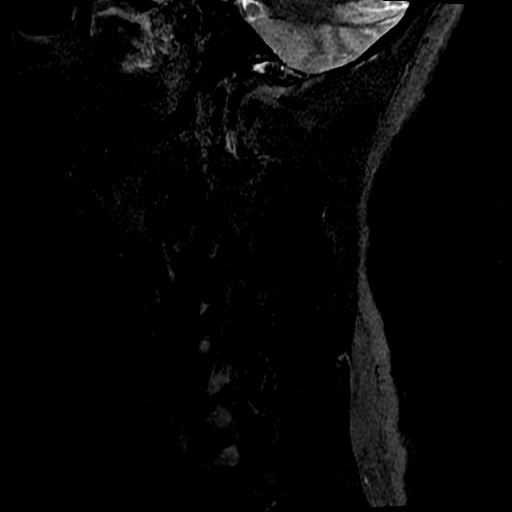

[Series 6: T2 · axial · 3.0mm · 0.35mm/px · z∈[-153,-55]mm · 8 of 36 slices shown (2 of 2)]
[im 1/36]
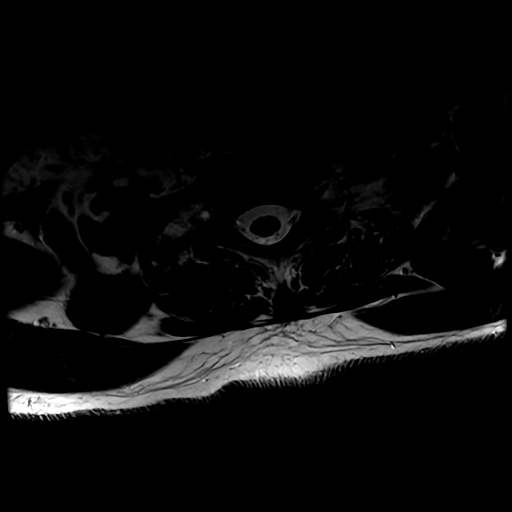
[im 5/36]
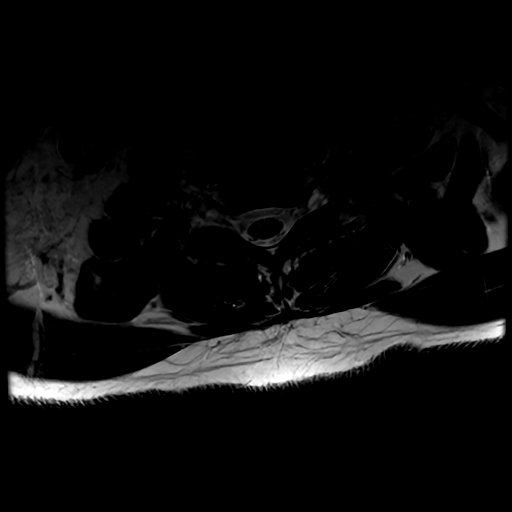
[im 9/36]
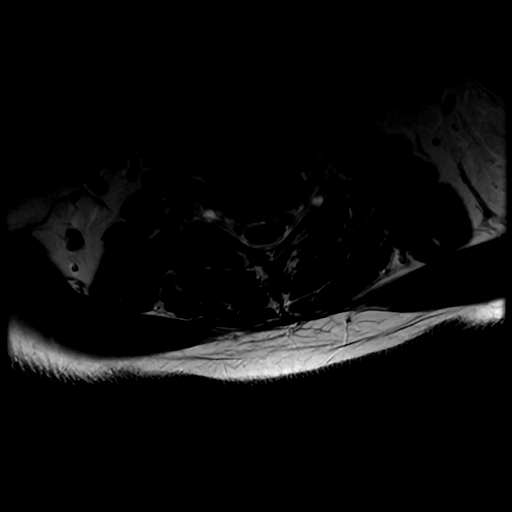
[im 14/36]
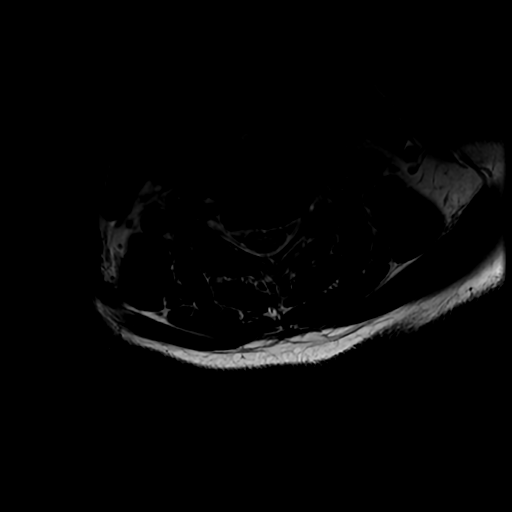
[im 18/36]
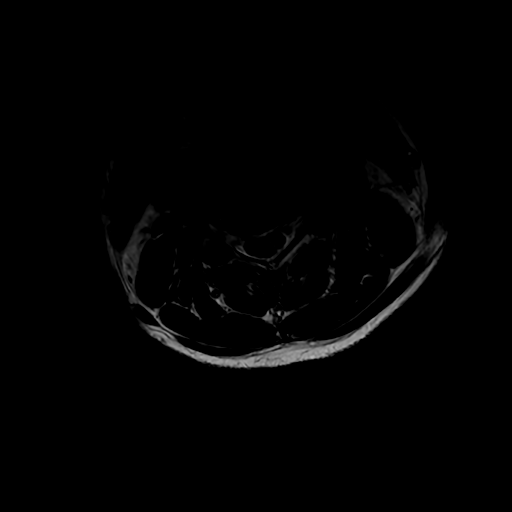
[im 22/36]
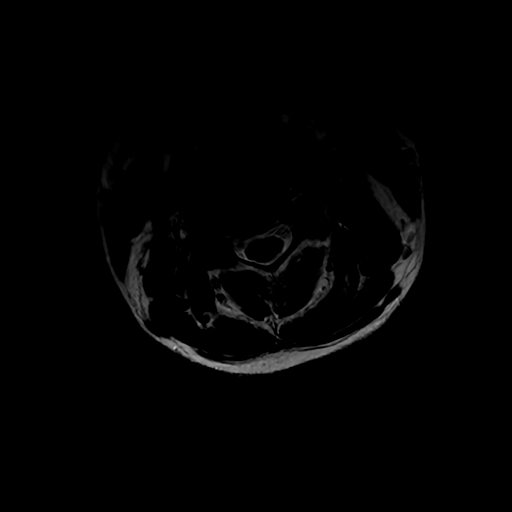
[im 27/36]
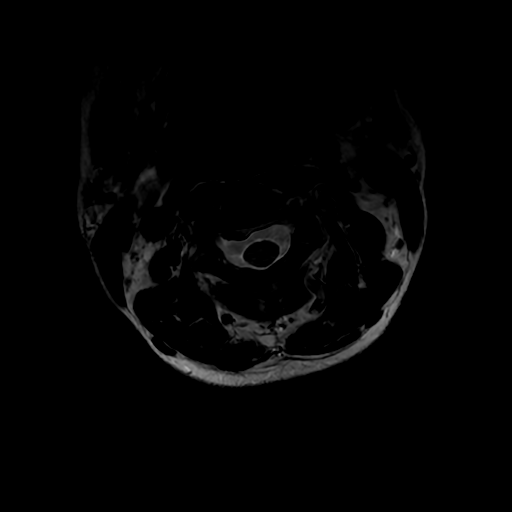
[im 31/36]
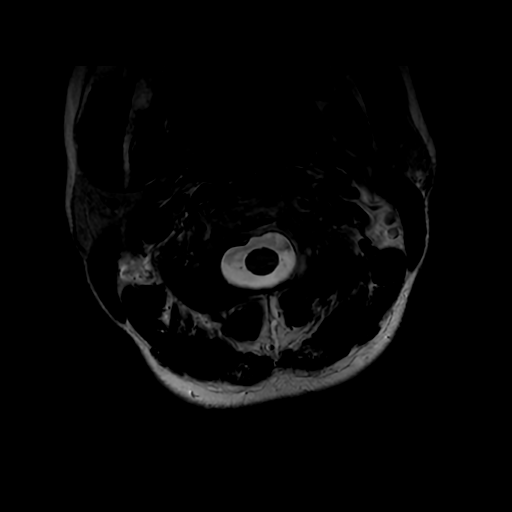

[19 of 48 positions shown; findings below may reference images not displayed]

FINDINGS: Alignment: Reversal of cervical lordosis

Vertebrae: No fracture, evidence of discitis, or bone lesion. Minor
discogenic endplate edema at C6-7.

Cord: Normal signal and morphology.

Posterior Fossa, vertebral arteries, paraspinal tissues: Prominent
thickness of the dorsal root ganglia and nerve root symmetrically.

Disc levels:

C2-3: Unremarkable.

C3-4: Right eccentric disc protrusion which could contact the
intradural C4 ventral nerve root

C4-5: Central disc protrusion with patent canal and foramina.

C5-6: Central disc protrusion. Left foraminal protrusion and ridging
with moderate left foraminal impingement.

C6-7: Disc narrowing and bulging with right paracentral protrusion.
Negative facets.

C7-T1:Unremarkable.
IMPRESSION: 1. Disc protrusions at C3-4 to C6-7.
2. C5-6 moderate left foraminal impingement due to disc degeneration
and uncovertebral ridging.
3. Diffusely patent spinal canal.
4. Prominent extra-spinal nerve root thickness, is there any
clinical indication of a hypertrophic neuropathy?

## 2021-11-10 ENCOUNTER — Ambulatory Visit (INDEPENDENT_AMBULATORY_CARE_PROVIDER_SITE_OTHER): Payer: Commercial Managed Care - PPO | Admitting: Orthopaedic Surgery

## 2021-11-10 ENCOUNTER — Encounter: Payer: Self-pay | Admitting: Orthopaedic Surgery

## 2021-11-10 DIAGNOSIS — G8929 Other chronic pain: Secondary | ICD-10-CM | POA: Diagnosis not present

## 2021-11-10 DIAGNOSIS — M25512 Pain in left shoulder: Secondary | ICD-10-CM

## 2021-11-10 NOTE — Progress Notes (Signed)
GUR:KYHCWCB returns today for follow-up of her left shoulder.  She has now been released from physical therapy.  She has met her goal of being able to lift 50 pounds.  She has been given a home exercise program.  She feels that she is ready to return to work full duties without restrictions.  She is quite pleased with her overall range of motion.  She states the only thing she likes is slight external rotation. ? ?Review of systems see HPI otherwise negative noncontributory. ? ?Physical exam: Left shoulder she has full overhead activity she is able to reach up into the mid back with the left arm.  She does have slight external rotation limitation compared to the right shoulder.  5 out of 5 strength with external and internal rotation against resistance bilateral shoulders.  Empty can test is negative bilaterally. ? ?Impression: Left shoulder arthrofibrosis ? ?Plan: She can return to work full duty as of 11/10/2021 without restrictions.  She will continue to work on range of motion and strengthening both shoulders.  Follow-up with [place] there is any questions concerns.  Questions were encouraged and answered. ?

## 2022-10-22 ENCOUNTER — Other Ambulatory Visit: Payer: Self-pay

## 2022-10-22 ENCOUNTER — Emergency Department (HOSPITAL_BASED_OUTPATIENT_CLINIC_OR_DEPARTMENT_OTHER)
Admission: EM | Admit: 2022-10-22 | Discharge: 2022-10-22 | Disposition: A | Discharge status: Home / Self Care | Payer: Commercial Managed Care - PPO | Attending: Emergency Medicine | Admitting: Emergency Medicine

## 2022-10-22 ENCOUNTER — Encounter (HOSPITAL_BASED_OUTPATIENT_CLINIC_OR_DEPARTMENT_OTHER): Payer: Self-pay

## 2022-10-22 DIAGNOSIS — F430 Acute stress reaction: Secondary | ICD-10-CM | POA: Insufficient documentation

## 2022-10-22 DIAGNOSIS — R200 Anesthesia of skin: Secondary | ICD-10-CM | POA: Diagnosis not present

## 2022-10-22 DIAGNOSIS — N809 Endometriosis, unspecified: Secondary | ICD-10-CM | POA: Insufficient documentation

## 2022-10-22 DIAGNOSIS — Z853 Personal history of malignant neoplasm of breast: Secondary | ICD-10-CM | POA: Diagnosis not present

## 2022-10-22 DIAGNOSIS — F410 Panic disorder [episodic paroxysmal anxiety] without agoraphobia: Secondary | ICD-10-CM

## 2022-10-22 DIAGNOSIS — Z171 Estrogen receptor negative status [ER-]: Secondary | ICD-10-CM | POA: Insufficient documentation

## 2022-10-22 DIAGNOSIS — R064 Hyperventilation: Secondary | ICD-10-CM | POA: Diagnosis present

## 2022-10-22 LAB — BASIC METABOLIC PANEL
Anion gap: 11 (ref 5–15)
BUN: 9 mg/dL (ref 6–20)
CO2: 21 mmol/L — ABNORMAL LOW (ref 22–32)
Calcium: 9.3 mg/dL (ref 8.9–10.3)
Chloride: 107 mmol/L (ref 98–111)
Creatinine, Ser: 0.88 mg/dL (ref 0.44–1.00)
GFR, Estimated: >60 mL/min (ref >60)
Glucose, Bld: 78 mg/dL (ref 70–99)
Potassium: 3.7 mmol/L (ref 3.5–5.1)
Sodium: 139 mmol/L (ref 135–145)

## 2022-10-22 LAB — CBC WITH DIFFERENTIAL/PLATELET
Abs Immature Granulocytes: 0.02 10*3/uL (ref 0.00–0.07)
Basophils Absolute: 0 10*3/uL (ref 0.0–0.1)
Basophils Relative: 1 %
Eosinophils Absolute: 0.1 10*3/uL (ref 0.0–0.5)
Eosinophils Relative: 1 %
HCT: 39.4 % (ref 36.0–46.0)
Hemoglobin: 13.6 g/dL (ref 12.0–15.0)
Immature Granulocytes: 0 %
Lymphocytes Relative: 35 %
Lymphs Abs: 2.3 10*3/uL (ref 0.7–4.0)
MCH: 30.1 pg (ref 26.0–34.0)
MCHC: 34.5 g/dL (ref 30.0–36.0)
MCV: 87.2 fL (ref 80.0–100.0)
Monocytes Absolute: 0.7 10*3/uL (ref 0.1–1.0)
Monocytes Relative: 11 %
Neutro Abs: 3.4 10*3/uL (ref 1.7–7.7)
Neutrophils Relative %: 52 %
Platelets: 288 10*3/uL (ref 150–400)
RBC: 4.52 MIL/uL (ref 3.87–5.11)
RDW: 11.7 % (ref 11.5–15.5)
WBC: 6.6 10*3/uL (ref 4.0–10.5)
nRBC: 0 % (ref 0.0–0.2)

## 2022-10-22 LAB — MAGNESIUM: Magnesium: 2 mg/dL (ref 1.7–2.4)

## 2022-10-22 LAB — TROPONIN I (HIGH SENSITIVITY): Troponin I (High Sensitivity): <2 ng/L (ref <18)

## 2022-10-22 LAB — CBG MONITORING, ED: Glucose-Capillary: 68 mg/dL — ABNORMAL LOW (ref 70–99)

## 2022-10-22 MED ORDER — HYDROXYZINE HCL 25 MG PO TABS: 25.0000 mg | ORAL_TABLET | Freq: Four times a day (QID) | ORAL | 0 refills | Status: DC

## 2022-10-22 MED ORDER — LORAZEPAM 1 MG PO TABS
0.5000 mg | ORAL_TABLET | Freq: Once | ORAL | Status: AC
  Administered 2022-10-22: 0.5 mg via ORAL
  Filled 2022-10-22: qty 1

## 2022-10-22 NOTE — ED Provider Notes (Signed)
Rita Ballard EMERGENCY DEPARTMENT AT [facility] Provider Note   CSN: [id number] Arrival date & time: 10/22/22  1926     History  Chief Complaint  Patient presents with   Numbness    1 hour of hyperventilation numbness bilateral extremities and lower extremities. Recent dx UTI  on antibiotics     Rita Ballard is a 47 y.o. female with breast cancer, who presents with with her husband for concern for lightheadedness, chest tightness, shortness of breath, and bilateral hand and feet numbness.  Patient states that she was extremely upset and crying over a "family issue" and began to feel chest tightness, lightheadedness, shortness of breath.  She laid down on the couch because she thought she was going to pass out and noted that she felt too weak to raise her arms.  She began to have trouble breathing, could not control her breaths and was breathing very fast, and then began to have numbness in her bilateral hands and legs.  She is never experienced anything like this before.  Since arriving in the emergency department she states her symptoms have mostly resolved and she just feels very generally weak and fatigued at this time.  She states she is not generally an anxious person but was just very upset about this 1 particular issue.  HPI     Home Medications Prior to Admission medications   Medication Sig Start Date End Date Taking? Authorizing Provider  cephALEXin (KEFLEX) 500 MG capsule Take 500 mg by mouth 2 (two) times daily. 10/20/22  Yes [provider]  hydrOXYzine (ATARAX) 25 MG tablet Take 1 tablet (25 mg total) by mouth every 6 (six) hours. As needed for panic or anxiety 10/22/22  Yes Loetta Rough, MD  Multiple Vitamin (MULTIVITAMIN) capsule Take 1 capsule by mouth daily. 10/25/19   Serena Croissant, MD  Omega-3 Fatty Acids (FISH OIL) 1000 MG CPDR Take 1 capsule by mouth daily. 10/25/19   Serena Croissant, MD      Allergies    Patient has no known allergies.     Review of Systems   Review of Systems Review of systems Negative for f/c, LOC.  A 10 point review of systems was performed and is negative unless otherwise reported in HPI.  Physical Exam Updated Vital Signs BP 120/87   Pulse (!) 59   Temp 97.8 F (36.6 C) (Oral)   Resp 16   Ht 5\' 8"  (1.727 m)   Wt 80.7 kg   LMP 04/14/2020   SpO2 100%   BMI 27.06 kg/m  Physical Exam General: Normal appearing female, lying in bed.  HEENT: PERRLA, Sclera anicteric, MMM, trachea midline.  Cardiology: RRR, no murmurs/rubs/gallops. BL radial and DP pulses equal bilaterally.  Resp: Normal respiratory rate and effort. CTAB, no wheezes, rhonchi, crackles.  Abd: Soft, non-tender, non-distended. No rebound tenderness or guarding.  GU: Deferred. MSK: No peripheral edema or signs of trauma. Extremities without deformity or TTP. No cyanosis or clubbing. Skin: warm, dry. No rashes or lesions. Back: No CVA tenderness Neuro: A&Ox4, CNs II-XII grossly intact. MAEs. Sensation grossly intact.  Psych: Normal mood and affect.   ED Results / Procedures / Treatments   Labs (all labs ordered are listed, but only abnormal results are displayed) Labs Reviewed  BASIC METABOLIC PANEL - Abnormal; Notable for the following components:      Result Value   CO2 21 (*)    All other components within normal limits  CBG MONITORING, ED - Abnormal; Notable  for the following components:   Glucose-Capillary 68 (*)    All other components within normal limits  CBC WITH DIFFERENTIAL/PLATELET  MAGNESIUM  TROPONIN I (HIGH SENSITIVITY)    EKG EKG Interpretation  Date/Time:  Thursday October 22 2022 19:34:57 EDT Ventricular Rate:  83 PR Interval:  123 QRS Duration: 93 QT Interval:  394 QTC Calculation: 463 R Axis:   120 Text Interpretation: Sinus rhythm Right axis deviation Minimal ST depression, diffuse leads No prior EKG for comparison Confirmed by Vivi Barrack [phone]) on 10/22/2022 7:40:02 PM  Radiology No results  found.  Procedures Procedures    Medications Ordered in ED Medications  LORazepam (ATIVAN) tablet 0.5 mg (0.5 mg Oral Given 10/22/22 2056)    ED Course/ Medical Decision Making/ A&P                          Medical Decision Making Amount and/or Complexity of Data Reviewed Labs: ordered.  Risk Prescription drug management.    This patient presents to the ED for concern of presyncope, chest tightness, numbness; this involves an extensive number of treatment options, and is a complaint that carries with it a high risk of complications and morbidity.  However, patient's symptoms had mostly resolved since arrival to the ED and she feels better, VSS, afebrile, well-appearing.  MDM:    Patient with an episode of presyncope, chest tightness, shortness of breath, and extremity numbness in the setting of significant emotional distress and sensation of panic.  Patient symptoms likely due to a panic attack today.  Her workup is very reassuring against a serious or life-threatening cause of presyncope or chest pain including ACS with negative troponin, arrhythmia with reassuring EKG and telemetry, electrolyte abnormality with normal BMP and magnesium, anemia with normal hemoglobin, and patient perks out for PE, with no signs or symptoms of DVT on exam.  Initial glucose is 68 however her repeat on BMP is 78.  Patient has had no recurrent shortness of breath, hypoxia, or respiratory distress to indicate pulmonary edema/pleural effusion/pneumonia, no fever or leukocytosis.  After Ativan 0.5 mg p.o. patient is very reassured with her negative workup.  I think patient is safe for discharge at this time.  I recommend follow-up with her PCP in 1 to 2 weeks.  I discussed prescription for hydroxyzine as needed for sensation of panic as a rescue medication and patient reports understanding.  Discharged with discharge instructions and return precautions.  Clinical Course as of 10/22/22 2136  Thu Oct 22, 2022   2134 Troponin I (High Sensitivity): <2 [HN]  2134 Magnesium: 2.0 [HN]  2134 Basic metabolic panel(!) unremarakble [HN]  2134 CBC with Differential wnl [HN]    Clinical Course User Index [HN] Loetta Rough, MD    Labs: I Ordered, and personally interpreted labs.  The pertinent results include: Those listed above  Additional history obtained from husband at bedside.   Cardiac Monitoring: The patient was maintained on a cardiac monitor.  I personally viewed and interpreted the cardiac monitored which showed an underlying rhythm of: NSR  Reevaluation: After the interventions noted above, I reevaluated the patient and found that they have :resolved  Social Determinants of Health: Patient lives independently   Disposition:  DC w/ discharge instructions/return precautions. All questions answered to patient's satisfaction.    Co morbidities that complicate the patient evaluation  Past Medical History:  Diagnosis Date   Anemia    iron   Cancer 2021   Ductal  left breast -upper left quad   Complication of anesthesia    slow to wake up with general anesthesia with C/S - no epidural or spinal   Headache    from breast surgery   History of heavy vaginal bleeding    Dr Estanislado Pandyivard - will do hysterectomy after patient recovers from breast ca surgery   Hx of endometriosis    Hx of scoliosis    As a child     Medicines Meds ordered this encounter  Medications   LORazepam (ATIVAN) tablet 0.5 mg   hydrOXYzine (ATARAX) 25 MG tablet    Sig: Take 1 tablet (25 mg total) by mouth every 6 (six) hours. As needed for panic or anxiety    Dispense:  12 tablet    Refill:  0    I have reviewed the patients home medicines and have made adjustments as needed  Problem List / ED Course: Problem List Items Addressed This Visit   None Visit Diagnoses     Panic attack as reaction to stress    -  Primary   Relevant Medications   LORazepam (ATIVAN) tablet 0.5 mg (Completed)   hydrOXYzine  (ATARAX) 25 MG tablet                   This note was created using dictation software, which may contain spelling or grammatical errors.    Loetta RoughNaasz, Dorethy Tomey N, MD 10/22/22 2136

## 2022-10-22 NOTE — ED Triage Notes (Addendum)
1 hour of hyperventilation numbness bilateral extremities and lower extremities. Recent dx UTI  on antibiotics. NSR on monitor RR 40  patient states prior to event "got upset" emotionally tearful.

## 2022-10-22 NOTE — Discharge Instructions (Addendum)
Thank you for coming to [facility] Emergency Department. You were seen for numbness, lightheadedness, chest tightness, trouble breathing. We did an exam, labs, and imaging, and these showed no acute findings. It is likely you experienced a panic attack today. Your workup in the ED was reassuring.  Please follow up with your primary care provider within 1 week.   We have prescribed hydroxyzine 25 mg as needed every 6 hours which is a medication you can take as a rescue for panic or anxiety.  If you need help dealing with a stressful family issue, you can log onto [email or web address], and find a counselor or licensed therapist in your area.  Do not hesitate to return to the ED or call 911 if you experience: -Worsening symptoms -Lightheadedness, passing out -Fevers/chills -Anything else that concerns you

## 2023-05-14 ENCOUNTER — Encounter: Payer: Self-pay | Admitting: Plastic Surgery

## 2023-05-14 ENCOUNTER — Ambulatory Visit (INDEPENDENT_AMBULATORY_CARE_PROVIDER_SITE_OTHER): Payer: Commercial Managed Care - PPO | Admitting: Plastic Surgery

## 2023-05-14 VITALS — BP 124/80 | HR 59

## 2023-05-14 DIAGNOSIS — Z853 Personal history of malignant neoplasm of breast: Secondary | ICD-10-CM

## 2023-05-14 DIAGNOSIS — Z9013 Acquired absence of bilateral breasts and nipples: Secondary | ICD-10-CM

## 2023-05-14 DIAGNOSIS — N651 Disproportion of reconstructed breast: Secondary | ICD-10-CM

## 2023-05-14 NOTE — Progress Notes (Signed)
Patient ID: Rita Ballard, female    DOB: 07-19-1975, 47 y.o.   MRN: [id number]   Chief Complaint  Patient presents with   Follow-up    The patient is a 47 year old female here for a 1 year follow-up from her breast reconstruction.  In 2021 she went for routine mammogram and was found to have calcifications of the left breast in the upper outer quadrant.  The pathology showed ductal carcinoma in situ with calcifications.  It was estrogen and progesterone negative.  The patient opted for bilateral mastectomies.  She is 5 feet 8 inches tall and is around 270 pounds.  In May 2021 she had mastectomies bilaterally with expander placement.  In August 2021 she had the expanders removed and had Mentor smooth round ultrahigh profile 400 cc implants placed. In December 2021 she had lipo filling of both breasts.  He has done very well since that time and has not had any issues with her breasts.  The biggest challenge has been with her arm range of motion and numbness.  It has gotten better over time but is still present.    Review of Systems  Constitutional: Negative.   HENT: Negative.    Eyes: Negative.   Respiratory: Negative.  Negative for chest tightness.   Cardiovascular: Negative.   Gastrointestinal: Negative.   Endocrine: Negative.   Genitourinary: Negative.   Musculoskeletal: Negative.     Past Medical History:  Diagnosis Date   Anemia    iron   Cancer (HCC) 2021   Ductal left breast -upper left quad   Complication of anesthesia    slow to wake up with general anesthesia with C/S - no epidural or spinal   Headache    from breast surgery   History of heavy vaginal bleeding    Dr Estanislado Pandy - will do hysterectomy after patient recovers from breast ca surgery   Hx of endometriosis    Hx of scoliosis    As a child    Past Surgical History:  Procedure Laterality Date   BREAST RECONSTRUCTION WITH PLACEMENT OF TISSUE EXPANDER AND FLEX HD (ACELLULAR HYDRATED DERMIS) Bilateral  11/30/2019   Procedure: BREAST RECONSTRUCTION WITH PLACEMENT OF TISSUE EXPANDER AND FLEX HD (ACELLULAR HYDRATED DERMIS);  Surgeon: Peggye Form, DO;  Location: [facility];  Service: Plastics;  Laterality: Bilateral;   CESAREAN SECTION     x 1   LIPOSUCTION WITH LIPOFILLING Bilateral 06/27/2020   Procedure: fat filling of both breasts;  Surgeon: Peggye Form, DO;  Location: [facility];  Service: Plastics;  Laterality: Bilateral;  1.5 hours   MASTECTOMY W/ SENTINEL NODE BIOPSY Bilateral 11/30/2019   Procedure: BILATERAL MASTECTOMY WITH LEFT AXILLARY SENTINEL LYMPH NODE BIOPSY;  Surgeon: Ovidio Kin, MD;  Location: [facility];  Service: General;  Laterality: Bilateral;  BIL PEC BLOCK   REMOVAL OF BILATERAL TISSUE EXPANDERS WITH PLACEMENT OF BILATERAL BREAST IMPLANTS Bilateral 02/29/2020   Procedure: REMOVAL OF BILATERAL TISSUE EXPANDERS WITH PLACEMENT OF BILATERAL BREAST IMPLANTS;  Surgeon: Peggye Form, DO;  Location: [facility];  Service: Plastics;  Laterality: Bilateral;  2 hours, please   ROBOTIC ASSISTED LAPAROSCOPIC HYSTERECTOMY AND SALPINGECTOMY Bilateral 04/25/2020   Procedure: XI ROBOTIC ASSISTED LAPAROSCOPIC HYSTERECTOMY AND SALPINGECTOMY;  Surgeon: Silverio Lay, MD;  Location: [facility];  Service: Gynecology;  Laterality: Bilateral;   WISDOM TOOTH EXTRACTION        Current Outpatient Medications:    Multiple Vitamin (MULTIVITAMIN) capsule, Take 1 capsule by  mouth daily., Disp:  , Rfl:    Omega-3 Fatty Acids (FISH OIL) 1000 MG CPDR, Take 1 capsule by mouth daily., Disp: , Rfl:    Objective:   Vitals:   05/14/23 1144  BP: 124/80  Pulse: (!) 59  SpO2: 99%    Physical Exam Vitals and nursing note reviewed.  Constitutional:      Appearance: Normal appearance.  HENT:     Head: Normocephalic and atraumatic.  Cardiovascular:     Rate and Rhythm: Normal rate.     Pulses: Normal pulses.  Pulmonary:     Effort: Pulmonary effort is normal.   Abdominal:     Palpations: Abdomen is soft.  Skin:    General: Skin is warm.     Capillary Refill: Capillary refill takes less than 2 seconds.     Coloration: Skin is not jaundiced.     Findings: No bruising.  Neurological:     Mental Status: She is alert and oriented to person, place, and time.  Psychiatric:        Mood and Affect: Mood normal.        Behavior: Behavior normal.        Thought Content: Thought content normal.        Judgment: Judgment normal.     Assessment & Plan:  Breast asymmetry following reconstructive surgery  Acquired absence of breast and absent nipple, bilateral  The patient does not want nipple areola tattooing right now.  She does not want to do any physical therapy right now but will consider it in the future   Pictures were obtained of the patient and placed in the chart with the patient's or guardian's permission.   Alena Bills Valorie Mcgrory, DO

## 2023-05-20 ENCOUNTER — Telehealth: Payer: Self-pay | Admitting: Plastic Surgery

## 2023-05-20 NOTE — Telephone Encounter (Signed)
Pt called asking when someone was going to call her about sch an [place], pt does not have an [place] order in the system from [place]. Pt stated that she was told she needed and [place]. Please advise

## 2023-05-21 ENCOUNTER — Other Ambulatory Visit: Payer: Self-pay | Admitting: Plastic Surgery

## 2023-05-21 ENCOUNTER — Telehealth: Payer: Self-pay | Admitting: *Deleted

## 2023-05-21 ENCOUNTER — Encounter: Payer: Self-pay | Admitting: Plastic Surgery

## 2023-05-21 DIAGNOSIS — Z9013 Acquired absence of bilateral breasts and nipples: Secondary | ICD-10-CM

## 2023-05-21 DIAGNOSIS — N651 Disproportion of reconstructed breast: Secondary | ICD-10-CM

## 2023-05-21 NOTE — Progress Notes (Signed)
US breast ordered.

## 2023-05-21 NOTE — Telephone Encounter (Signed)
Called and [facility] informing the patient that an order has been placed for (BL) Breast Ultrasound.    Informed her she can call the [facility] and asked to be scheduled for the US.//AB/CMA

## 2023-06-30 ENCOUNTER — Other Ambulatory Visit: Payer: Self-pay | Admitting: Plastic Surgery

## 2023-06-30 ENCOUNTER — Ambulatory Visit
Admission: RE | Admit: 2023-06-30 | Discharge: 2023-06-30 | Disposition: A | Discharge status: Home / Self Care | Payer: Commercial Managed Care - PPO | Source: Ambulatory Visit | Attending: Plastic Surgery | Admitting: Plastic Surgery

## 2023-06-30 DIAGNOSIS — N651 Disproportion of reconstructed breast: Secondary | ICD-10-CM

## 2023-06-30 DIAGNOSIS — Z9013 Acquired absence of bilateral breasts and nipples: Secondary | ICD-10-CM

## 2023-07-01 ENCOUNTER — Telehealth: Payer: Self-pay | Admitting: *Deleted

## 2023-07-01 NOTE — Telephone Encounter (Signed)
-----   Message from Alena Bills Dillingham sent at 06/30/2023  1:35 PM EST ----- Please let pt know U/S all good ----- Message ----- From: Interface, Rad Results In Sent: 06/30/2023   1:12 PM EST To: Alena Bills Dillingham, DO

## 2023-07-01 NOTE — Telephone Encounter (Signed)
Called and spoke with the patient and informed her of recent US results.  Patient verbalized understanding.//AB/CMA

## 2024-05-16 ENCOUNTER — Ambulatory Visit: Payer: Commercial Managed Care - PPO | Admitting: Plastic Surgery

## 2024-05-16 ENCOUNTER — Encounter: Payer: Self-pay | Admitting: Plastic Surgery

## 2024-05-16 VITALS — BP 112/77 | HR 66

## 2024-05-16 DIAGNOSIS — Z08 Encounter for follow-up examination after completed treatment for malignant neoplasm: Secondary | ICD-10-CM | POA: Diagnosis not present

## 2024-05-16 DIAGNOSIS — Z171 Estrogen receptor negative status [ER-]: Secondary | ICD-10-CM | POA: Diagnosis not present

## 2024-05-16 DIAGNOSIS — Z853 Personal history of malignant neoplasm of breast: Secondary | ICD-10-CM | POA: Diagnosis not present

## 2024-05-16 DIAGNOSIS — Z9013 Acquired absence of bilateral breasts and nipples: Secondary | ICD-10-CM

## 2024-05-16 NOTE — Progress Notes (Signed)
 Patient ID: Rita Ballard, female    DOB: 03/16/1976, 48 y.o.   MRN: [id number]   Chief Complaint  Patient presents with   Follow-up    The patient is a 48 year old female here for 1 year follow-up.  In 2021 she had an abnormal mammogram which led to pathologic evaluation and ductal carcinoma in situ.  The lesion was estrogen and progesterone negative.  She opted for bilateral mastectomies.  She had reconstruction.  The implants were placed in August 2021.  She has Mentor smooth round ultra high-profile gel 400 cc implants.  She then had some fat grafting in December.  Overall she is happy with her results.  She does not have nipple areola tattooing and does not want it right now.  She had an ultrasound last year.  No areas for concern.     Review of Systems  Constitutional: Negative.   HENT: Negative.    Eyes: Negative.   Respiratory: Negative.    Cardiovascular: Negative.   Gastrointestinal: Negative.   Endocrine: Negative.   Genitourinary: Negative.   Musculoskeletal: Negative.     Past Medical History:  Diagnosis Date   Anemia    iron   Cancer (HCC) 2021   Ductal left breast -upper left quad   Complication of anesthesia    slow to wake up with general anesthesia with C/S - no epidural or spinal   Headache    from breast surgery   History of heavy vaginal bleeding    Dr Darcel - will do hysterectomy after patient recovers from breast ca surgery   Hx of endometriosis    Hx of scoliosis    As a child    Past Surgical History:  Procedure Laterality Date   BREAST RECONSTRUCTION WITH PLACEMENT OF TISSUE EXPANDER AND FLEX HD (ACELLULAR HYDRATED DERMIS) Bilateral 11/30/2019   Procedure: BREAST RECONSTRUCTION WITH PLACEMENT OF TISSUE EXPANDER AND FLEX HD (ACELLULAR HYDRATED DERMIS);  Surgeon: Lowery Estefana RAMAN, DO;  Location: [facility];  Service: Plastics;  Laterality: Bilateral;   CESAREAN SECTION     x 1   LIPOSUCTION WITH LIPOFILLING Bilateral 06/27/2020    Procedure: fat filling of both breasts;  Surgeon: Lowery Estefana RAMAN, DO;  Location: [facility];  Service: Plastics;  Laterality: Bilateral;  1.5 hours   MASTECTOMY W/ SENTINEL NODE BIOPSY Bilateral 11/30/2019   Procedure: BILATERAL MASTECTOMY WITH LEFT AXILLARY SENTINEL LYMPH NODE BIOPSY;  Surgeon: Ethyl Lenis, MD;  Location: [facility];  Service: General;  Laterality: Bilateral;  BIL PEC BLOCK   REMOVAL OF BILATERAL TISSUE EXPANDERS WITH PLACEMENT OF BILATERAL BREAST IMPLANTS Bilateral 02/29/2020   Procedure: REMOVAL OF BILATERAL TISSUE EXPANDERS WITH PLACEMENT OF BILATERAL BREAST IMPLANTS;  Surgeon: Lowery Estefana RAMAN, DO;  [facility];  Service: Plastics;  Laterality: Bilateral;  2 hours, please   ROBOTIC ASSISTED LAPAROSCOPIC HYSTERECTOMY AND SALPINGECTOMY Bilateral 04/25/2020   Procedure: XI ROBOTIC ASSISTED LAPAROSCOPIC HYSTERECTOMY AND SALPINGECTOMY;  Surgeon: Darcel Pool, MD;  Location: [facility];  Service: Gynecology;  Laterality: Bilateral;   WISDOM TOOTH EXTRACTION        Current Outpatient Medications:    Multiple Vitamin (MULTIVITAMIN) capsule, Take 1 capsule by mouth daily., Disp:  , Rfl:    Omega-3 Fatty Acids (FISH OIL ) 1000 MG CPDR, Take 1 capsule by mouth daily., Disp: , Rfl:    Objective:   Vitals:   05/16/24 0944  BP: 112/77  Pulse: 66  SpO2: 98%    Physical Exam Vitals  reviewed.  HENT:     Head: Atraumatic.  Cardiovascular:     Rate and Rhythm: Normal rate.     Pulses: Normal pulses.  Pulmonary:     Effort: Pulmonary effort is normal.  Abdominal:     Palpations: Abdomen is soft.  Skin:    General: Skin is warm.     Capillary Refill: Capillary refill takes less than 2 seconds.  Neurological:     Mental Status: She is alert and oriented to person, place, and time.  Psychiatric:        Mood and Affect: Mood normal.        Behavior: Behavior normal.        Thought Content: Thought content normal.     Assessment &  Plan:  Acquired absence of breast and absent nipple, bilateral  Malignant neoplasm of upper-outer quadrant of left breast in female, estrogen receptor negative (HCC)  Continue self-exam.  Plan to see her back in 1 year.  No changes.  She does not want to do nipple areolar tattooing and that is perfectly fine.  Pictures were obtained of the patient and placed in the chart with the patient's or guardian's permission.   Estefana RAMAN Mekia Dipinto, DO

## 2025-05-18 ENCOUNTER — Ambulatory Visit: Admitting: Plastic Surgery
